# Patient Record
Sex: Female | Born: 1975 | Race: White | Hispanic: No | Marital: Single | Smoking: Never smoker
Health system: Southern US, Community
[De-identification: ages and names within clinical notes are randomized; demographics above are authoritative.]

## PROBLEM LIST (undated history)

## (undated) DIAGNOSIS — N979 Female infertility, unspecified: Secondary | ICD-10-CM

## (undated) DIAGNOSIS — Z9289 Personal history of other medical treatment: Secondary | ICD-10-CM

## (undated) DIAGNOSIS — R87610 Atypical squamous cells of undetermined significance on cytologic smear of cervix (ASC-US): Secondary | ICD-10-CM

## (undated) HISTORY — PX: WISDOM TOOTH EXTRACTION: SHX21

## (undated) HISTORY — DX: Atypical squamous cells of undetermined significance on cytologic smear of cervix (ASC-US): R87.610

## (undated) HISTORY — DX: Female infertility, unspecified: N97.9

## (undated) HISTORY — DX: Personal history of other medical treatment: Z92.89

---

## 2007-08-26 ENCOUNTER — Inpatient Hospital Stay (HOSPITAL_COMMUNITY): Admission: AD | Admit: 2007-08-26 | Discharge: 2007-08-26 | Payer: Self-pay | Admitting: *Deleted

## 2009-05-22 ENCOUNTER — Emergency Department: Payer: Self-pay | Admitting: Emergency Medicine

## 2012-11-07 ENCOUNTER — Encounter: Payer: Self-pay | Admitting: *Deleted

## 2012-11-08 ENCOUNTER — Ambulatory Visit (INDEPENDENT_AMBULATORY_CARE_PROVIDER_SITE_OTHER): Payer: BC Managed Care – PPO | Admitting: Podiatry

## 2012-11-08 DIAGNOSIS — M722 Plantar fascial fibromatosis: Secondary | ICD-10-CM

## 2012-11-08 NOTE — Patient Instructions (Signed)
WEARING INSTRUCTIONS FOR ORTHOTICS  Don't expect to be comfortable wearing your orthotic devices for the first time.  Like eyeglasses, you may be aware of them as time passes, they will not be uncomfortable and you will enjoy wearing them.  FOLLOW THESE INSTRUCTIONS EXACTLY!  1. Wear your orthotic devices for:       Not more than 1 hour the first day.       Not more than 2 hours the second day.       Not more than 3 hours the third day and so on.        Or wear them for as long as they feel comfortable.       If you experience discomfort in your feet or legs take them out.  When feet & legs feel       better, put them back in.  You do need to be consistent and wear them a [place]        everyday. 2.   If at any time the orthotic devices become acutely uncomfortable before the       time for that particular day, STOP WEARING THEM. 3.   On the next day, do not increase the wearing time. 4.   Subsequently, increase the wearing time by 15-30 minutes only if comfortable to do       so. 5.   You will be seen by your doctor about 2-4 weeks after you receive your orthotic       devices, at which time you will probably be wearing your devices comfortably        for about 8 hours or more a day. 6.   Some patients occasionally report mild aches or discomfort in other parts of the of       body such as the knees, hips or back after 3 or 4 consecutive hours of wear.  If this       is the case with you, do not extend your wearing time.  Instead, cut it back an hour or       two.  In all likelihood, these symptoms will disappear in a short period of time as your       body posture realigns itself and functions more efficiently. 7.   It is possible that your orthotic device may require some small changes or adjustment       to improve their function or make them more comfortable.   This is usually not done       before one to three months have elapsed.  These adjustments are made in        accordance  with the changed position your feet are assuming as a result of       improved biomechanical function. 8.   In women's shoes, it's not unusual for your heel to slip out of the shoe, particularly if       they are step-in-shoes.  If this is the case, try other shoes or other styles.  Try to       purchase shoes which have deeper heal seats or higher heel counters. 9.   Squeaking of orthotics devices in the shoes is due to the movement of the devices       when they are functioning normally.  To eliminate squeaking, simply dust some       baby powder into your shoes before inserting the devices.  If this does not work,          apply soap or wax to the edges of the orthotic devices or put a tissue into the shoes. 10. It is important that you follow these directions explicitly.  Failure to do so will simply       prolong the adjustment period or create problems which are easily avoided.  It makes       no difference if you are wearing your orthotic devices for only a few hours after        several months, so long as you are wearing them comfortably for those hours. 11. If you have any questions or complaints, contact our office.  We have no way of       knowing about your problems unless you tell us.  If we do not hear from you, we will       assume that you are proceeding well.  

## 2012-11-08 NOTE — Progress Notes (Signed)
Pt presents for orthotic pick up , dispensed orthotics

## 2013-08-30 ENCOUNTER — Ambulatory Visit: Payer: Self-pay | Admitting: Family Medicine

## 2014-03-05 ENCOUNTER — Ambulatory Visit: Payer: Self-pay | Admitting: Nurse Practitioner

## 2015-08-14 DIAGNOSIS — Z9289 Personal history of other medical treatment: Secondary | ICD-10-CM

## 2015-08-14 DIAGNOSIS — R87610 Atypical squamous cells of undetermined significance on cytologic smear of cervix (ASC-US): Secondary | ICD-10-CM

## 2015-08-14 HISTORY — DX: Personal history of other medical treatment: Z92.89

## 2015-08-14 HISTORY — DX: Atypical squamous cells of undetermined significance on cytologic smear of cervix (ASC-US): R87.610

## 2015-08-18 ENCOUNTER — Other Ambulatory Visit: Payer: Self-pay | Admitting: Obstetrics and Gynecology

## 2015-08-18 DIAGNOSIS — R928 Other abnormal and inconclusive findings on diagnostic imaging of breast: Secondary | ICD-10-CM

## 2015-09-03 ENCOUNTER — Ambulatory Visit
Admission: RE | Admit: 2015-09-03 | Discharge: 2015-09-03 | Disposition: A | Discharge status: Home / Self Care | Payer: BC Managed Care – PPO | Source: Ambulatory Visit | Attending: Obstetrics and Gynecology | Admitting: Obstetrics and Gynecology

## 2015-09-03 DIAGNOSIS — R921 Mammographic calcification found on diagnostic imaging of breast: Secondary | ICD-10-CM | POA: Diagnosis not present

## 2015-09-03 DIAGNOSIS — R928 Other abnormal and inconclusive findings on diagnostic imaging of breast: Secondary | ICD-10-CM

## 2016-08-02 ENCOUNTER — Telehealth: Payer: Self-pay

## 2016-08-02 NOTE — Telephone Encounter (Signed)
Pt would like an order for mammogram to be done this summer before school starts and before her annual on 8/22nd.  [phone]  ([facility]15)

## 2016-08-03 ENCOUNTER — Other Ambulatory Visit: Payer: Self-pay | Admitting: Obstetrics and Gynecology

## 2016-08-03 DIAGNOSIS — Z1239 Encounter for other screening for malignant neoplasm of breast: Secondary | ICD-10-CM

## 2016-08-03 NOTE — Telephone Encounter (Signed)
Order in. RN to notify pt to contact [facility] to sched.

## 2016-08-03 NOTE — Telephone Encounter (Signed)
Left detailed msg.

## 2016-09-08 ENCOUNTER — Other Ambulatory Visit: Payer: Self-pay | Admitting: Obstetrics and Gynecology

## 2016-09-08 ENCOUNTER — Ambulatory Visit
Admission: RE | Admit: 2016-09-08 | Discharge: 2016-09-08 | Disposition: A | Discharge status: Home / Self Care | Payer: BC Managed Care – PPO | Source: Ambulatory Visit | Attending: Obstetrics and Gynecology | Admitting: Obstetrics and Gynecology

## 2016-09-08 DIAGNOSIS — N6310 Unspecified lump in the right breast, unspecified quadrant: Secondary | ICD-10-CM

## 2016-09-08 DIAGNOSIS — R928 Other abnormal and inconclusive findings on diagnostic imaging of breast: Secondary | ICD-10-CM

## 2016-09-08 DIAGNOSIS — N6313 Unspecified lump in the right breast, lower outer quadrant: Secondary | ICD-10-CM | POA: Insufficient documentation

## 2016-09-08 DIAGNOSIS — R921 Mammographic calcification found on diagnostic imaging of breast: Secondary | ICD-10-CM | POA: Diagnosis present

## 2016-09-08 DIAGNOSIS — Z1239 Encounter for other screening for malignant neoplasm of breast: Secondary | ICD-10-CM

## 2016-09-15 ENCOUNTER — Encounter: Payer: Self-pay | Admitting: Obstetrics and Gynecology

## 2016-09-15 ENCOUNTER — Ambulatory Visit (INDEPENDENT_AMBULATORY_CARE_PROVIDER_SITE_OTHER): Payer: BC Managed Care – PPO | Admitting: Obstetrics and Gynecology

## 2016-09-15 VITALS — BP 100/60 | HR 61 | Ht 67.0 in | Wt 116.0 lb

## 2016-09-15 DIAGNOSIS — L290 Pruritus ani: Secondary | ICD-10-CM

## 2016-09-15 DIAGNOSIS — Z1151 Encounter for screening for human papillomavirus (HPV): Secondary | ICD-10-CM | POA: Diagnosis not present

## 2016-09-15 DIAGNOSIS — Z124 Encounter for screening for malignant neoplasm of cervix: Secondary | ICD-10-CM | POA: Diagnosis not present

## 2016-09-15 DIAGNOSIS — Z1239 Encounter for other screening for malignant neoplasm of breast: Secondary | ICD-10-CM

## 2016-09-15 DIAGNOSIS — Z1231 Encounter for screening mammogram for malignant neoplasm of breast: Secondary | ICD-10-CM

## 2016-09-15 DIAGNOSIS — Z01419 Encounter for gynecological examination (general) (routine) without abnormal findings: Secondary | ICD-10-CM

## 2016-09-15 NOTE — Progress Notes (Signed)
Chief Complaint  Patient presents with  . Gynecologic Exam     HPI:      [name] is a 41 y.o. No obstetric history on file. who LMP was Patient's last menstrual period was 09/15/2016., presents today for her annual examination.  Her menses are regular every 32 days, lasting 5 days.  Dysmenorrhea mild, occurring first 1-2 days of flow. She does not have intermenstrual bleeding.  Sex activity: single partner, hx of infertility.  Last Pap: August 14, 2015  Results were: ASCUS with NEGATIVE high risk HPV /neg HPV DNA  Hx of STDs: none  Last mammogram: 09/08/16  Results were: Birads 4 for RT breast mass.  There is no FH of breast cancer. There is no FH of ovarian cancer. The patient does not do self-breast exams.  Tobacco use: The patient denies current or previous tobacco use. Alcohol use: none Exercise: moderately active  She does get adequate calcium and Vitamin D in her diet.  She needs a Rx RF on analpram. 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 Analpram relieves sx and she uses infrequently.    Past Medical History:  Diagnosis Date  . Atypical squamous cell changes of undetermined significance (ASCUS) on cervical cytology with negative high risk human papilloma virus (HPV) test result 08/14/2015  . History of Papanicolaou smear of cervix 08/14/2015   ASCUS/neg  . Infertility, female     No past surgical history on file.  Family History  Problem Relation Age of Onset  . Diabetes Mother   . Hypertension Mother   . Diabetes Maternal Grandmother   . Heart disease Maternal Grandmother   . Hypertension Maternal Grandmother   . Colon cancer Paternal Grandfather   . Breast cancer Neg Hx   . Ovarian cancer Neg Hx     Social History   Social History  . Marital status: Married    Spouse name: N/A  . Number of children: N/A  . Years of education: N/A   Occupational History  . Not on file.   Social  History Main Topics  . Smoking status: Never Smoker  . Smokeless tobacco: Never Used  . Alcohol use No  . Drug use: No  . Sexual activity: Not on file   Other Topics Concern  . Not on file   Social History Narrative  . No narrative on file     Current Outpatient Prescriptions:  .  calcium acetate (PHOSLO) 667 MG capsule, Take by mouth 3 (three) times daily with meals., Disp: , Rfl:  .  vitamin B-12 (CYANOCOBALAMIN) 100 MCG tablet, Take 100 mcg by mouth daily., Disp: , Rfl:  .  hydrocortisone-pramoxine (ANALPRAM-HC) 2.5-1 % rectal cream, Place 1 application rectally 3 (three) times daily., Disp: 30 g, Rfl: 1  ROS:  Review of Systems  Constitutional: Negative for fatigue, fever and unexpected weight change.  Respiratory: Negative for cough, shortness of breath and wheezing.   Cardiovascular: Negative for chest pain, palpitations and leg swelling.  Gastrointestinal: Negative for blood in stool, constipation, diarrhea, nausea and vomiting.  Endocrine: Negative for cold intolerance, heat intolerance and polyuria.  Genitourinary: Negative for dyspareunia, dysuria, flank pain, frequency, genital sores, hematuria, menstrual problem, pelvic pain, urgency, vaginal bleeding, vaginal discharge and vaginal pain.  Musculoskeletal: Negative for back pain, joint swelling and myalgias.  Skin: Negative for rash.  Neurological: Negative for dizziness, syncope, light-headedness, numbness and headaches.  Hematological: Negative for adenopathy.  Psychiatric/Behavioral: Negative for agitation, confusion,  sleep disturbance and suicidal ideas. The patient is not nervous/anxious.      Objective: BP 100/60   Pulse 61   Ht 5\' 7"  (1.702 m)   Wt 116 lb (52.6 kg)   LMP 09/15/2016   BMI 18.17 kg/m    Physical Exam  Constitutional: She is oriented to person, place, and time. She appears well-developed and well-nourished.  Genitourinary: Vagina normal and uterus normal. There is no rash or tenderness  on the right labia. There is no rash or tenderness on the left labia. No erythema or tenderness in the vagina. No vaginal discharge found. Right adnexum does not display mass and does not display tenderness. Left adnexum does not display mass and does not display tenderness. Cervix does not exhibit motion tenderness or polyp. Uterus is not enlarged or tender.  Neck: Normal range of motion. No thyromegaly present.  Cardiovascular: Normal rate, regular rhythm and normal heart sounds.   No murmur heard. Pulmonary/Chest: Effort normal and breath sounds normal. Right breast exhibits no mass, no nipple discharge, no skin change and no tenderness. Left breast exhibits no mass, no nipple discharge, no skin change and no tenderness.  Abdominal: Soft. There is no tenderness. There is no guarding.  Musculoskeletal: Normal range of motion.  Neurological: She is alert and oriented to person, place, and time. No cranial nerve deficit.  Psychiatric: She has a normal mood and affect. Her behavior is normal.  Vitals reviewed.    Assessment/Plan: Encounter for annual routine gynecological examination  Cervical cancer screening - Plan: IGP, Aptima HPV  Screening for HPV (human papillomavirus) - Plan: IGP, Aptima HPV  Screening for breast cancer - Pt is current on mammo.  Anal itch - Rx RF analpram prn sx.  - Plan: hydrocortisone-pramoxine (ANALPRAM-HC) 2.5-1 % rectal cream             GYN counsel adequate intake of calcium and vitamin D, diet and exercise     F/U  Return in about 1 year (around 09/15/2017).  [name], PA-C 09/16/2016 10:34 AM

## 2016-09-16 ENCOUNTER — Ambulatory Visit
Admission: RE | Admit: 2016-09-16 | Discharge: 2016-09-16 | Disposition: A | Discharge status: Home / Self Care | Payer: BC Managed Care – PPO | Source: Ambulatory Visit | Attending: Obstetrics and Gynecology | Admitting: Obstetrics and Gynecology

## 2016-09-16 ENCOUNTER — Other Ambulatory Visit: Payer: Self-pay | Admitting: Obstetrics and Gynecology

## 2016-09-16 DIAGNOSIS — N6310 Unspecified lump in the right breast, unspecified quadrant: Secondary | ICD-10-CM

## 2016-09-16 DIAGNOSIS — R928 Other abnormal and inconclusive findings on diagnostic imaging of breast: Secondary | ICD-10-CM | POA: Insufficient documentation

## 2016-09-16 MED ORDER — HYDROCORTISONE ACE-PRAMOXINE 2.5-1 % RE CREA: 1.0000 "application " | TOPICAL_CREAM | Freq: Three times a day (TID) | RECTAL | 1 refills | Status: DC

## 2016-09-18 LAB — IGP, APTIMA HPV
HPV Aptima: NEGATIVE
PAP Smear Comment: 0

## 2017-07-14 ENCOUNTER — Encounter: Payer: Self-pay | Admitting: Obstetrics and Gynecology

## 2017-07-14 ENCOUNTER — Ambulatory Visit (INDEPENDENT_AMBULATORY_CARE_PROVIDER_SITE_OTHER): Payer: BC Managed Care – PPO | Admitting: Obstetrics and Gynecology

## 2017-07-14 VITALS — BP 118/68 | HR 77 | Ht 67.0 in | Wt 108.0 lb

## 2017-07-14 DIAGNOSIS — L290 Pruritus ani: Secondary | ICD-10-CM | POA: Diagnosis not present

## 2017-07-14 DIAGNOSIS — B3731 Acute candidiasis of vulva and vagina: Secondary | ICD-10-CM

## 2017-07-14 DIAGNOSIS — B373 Candidiasis of vulva and vagina: Secondary | ICD-10-CM

## 2017-07-14 LAB — POCT WET PREP WITH KOH
Clue Cells Wet Prep HPF POC: NEGATIVE
KOH Prep POC: NEGATIVE
TRICHOMONAS UA: NEGATIVE
YEAST WET PREP PER HPF POC: NEGATIVE

## 2017-07-14 MED ORDER — HYDROCORTISONE ACE-PRAMOXINE 2.5-1 % RE CREA: 1.0000 "application " | TOPICAL_CREAM | Freq: Three times a day (TID) | RECTAL | 1 refills | Status: DC

## 2017-07-14 MED ORDER — FLUCONAZOLE 150 MG PO TABS: 150.0000 mg | ORAL_TABLET | Freq: Once | ORAL | 0 refills | Status: AC

## 2017-07-14 NOTE — Patient Instructions (Signed)
I value your feedback and entrusting us with your care. If you get a [facility] patient survey, I would appreciate you taking the time to let us know about your experience today. Thank you! 

## 2017-07-14 NOTE — Progress Notes (Signed)
Dear, Julie Iba, MD (Inactive)   Chief Complaint  Patient presents with  . Vaginitis    HPI:      [name] is a 42 y.o. No obstetric history on file. who LMP was No LMP recorded., presents today for vaginal itch, no d/c, odor for about a wk. Pt took leftover, expired diflucan that didn't help. Took 2nd diflucan 2 days ago with some improvement. Hx of yeast vag in past, requiring 2 diflucan. Doesn't like creams. No recent abx use, no new soaps/detergents/shower gels. Going camping in 2 days and wants relief.  Pt also needs RF on analpram crm prn anal itch.  Has annual 9/19.   Past Medical History:  Diagnosis Date  . Atypical squamous cell changes of undetermined significance (ASCUS) on cervical cytology with negative high risk human papilloma virus (HPV) test result 08/14/2015  . History of Papanicolaou smear of cervix 08/14/2015   ASCUS/neg  . Infertility, female     History reviewed. No pertinent surgical history.  Family History  Problem Relation Age of Onset  . Diabetes Mother   . Hypertension Mother   . Diabetes Maternal Grandmother   . Heart disease Maternal Grandmother   . Hypertension Maternal Grandmother   . Colon cancer Paternal Grandfather   . Breast cancer Neg Hx   . Ovarian cancer Neg Hx     Social History   Socioeconomic History  . Marital status: Married    Spouse name: Not on file  . Number of children: Not on file  . Years of education: Not on file  . Highest education level: Not on file  Occupational History  . Not on file  Social Needs  . Financial resource strain: Not on file  . Food insecurity:    Worry: Not on file    Inability: Not on file  . Transportation needs:    Medical: Not on file    Non-medical: Not on file  Tobacco Use  . Smoking status: Never Smoker  . Smokeless tobacco: Never Used  Substance and Sexual Activity  . Alcohol use: No  . Drug use: No  . Sexual activity: Yes  Lifestyle  . Physical activity:    Days  per week: Not on file    Minutes per session: Not on file  . Stress: Not on file  Relationships  . Social connections:    Talks on phone: Not on file    Gets together: Not on file    Attends religious service: Not on file    Active member of club or organization: Not on file    Attends meetings of clubs or organizations: Not on file    Relationship status: Not on file  . Intimate partner violence:    Fear of current or ex partner: Not on file    Emotionally abused: Not on file    Physically abused: Not on file    Forced sexual activity: Not on file  Other Topics Concern  . Not on file  Social History Narrative  . Not on file    Outpatient Medications Prior to Visit  Medication Sig Dispense Refill  . calcium acetate (PHOSLO) 667 MG capsule Take by mouth 3 (three) times daily with meals.    . vitamin B-12 (CYANOCOBALAMIN) 100 MCG tablet Take 100 mcg by mouth daily.    . hydrocortisone-pramoxine (ANALPRAM-HC) 2.5-1 % rectal cream Place 1 application rectally 3 (three) times daily. 30 g 1   No facility-administered medications prior to visit.  ROS:  Review of Systems  Constitutional: Negative for fever.  Gastrointestinal: Negative for blood in stool, constipation, diarrhea, nausea and vomiting.  Genitourinary: Negative for dyspareunia, dysuria, flank pain, frequency, hematuria, urgency, vaginal bleeding, vaginal discharge and vaginal pain.  Musculoskeletal: Negative for back pain.  Skin: Negative for rash.   BREAST: No symptoms   OBJECTIVE:   Vitals:  BP 118/68   Pulse 77   Ht 5\' 7"  (1.702 m)   Wt 108 lb (49 kg)   BMI 16.92 kg/m   Physical Exam  Constitutional: She is oriented to person, place, and time. Vital signs are normal. She appears well-developed.  Pulmonary/Chest: Effort normal.  Genitourinary: Vagina normal and uterus normal. There is rash on the right labia. There is no tenderness or lesion on the right labia. There is rash on the left labia.  There is no tenderness or lesion on the left labia. Uterus is not enlarged and not tender. Cervix exhibits no motion tenderness. Right adnexum displays no mass and no tenderness. Left adnexum displays no mass and no tenderness. No erythema or tenderness in the vagina. No vaginal discharge found.  Musculoskeletal: Normal range of motion.  Neurological: She is alert and oriented to person, place, and time.  Psychiatric: She has a normal mood and affect. Her behavior is normal. Thought content normal.  Vitals reviewed.   Results: Results for orders placed or performed in visit on 07/14/17 (from the past 24 hour(s))  POCT Wet Prep with KOH     Status: Normal   Collection Time: 07/14/17 11:51 AM  Result Value Ref Range   Trichomonas, UA Negative    Clue Cells Wet Prep HPF POC neg    Epithelial Wet Prep HPF POC  Few, Moderate, Many, Too numerous to count   Yeast Wet Prep HPF POC neg    Bacteria Wet Prep HPF POC  Few   RBC Wet Prep HPF POC     WBC Wet Prep HPF POC     KOH Prep POC Negative Negative     Assessment/Plan: Candidal vaginitis - Neg wet prep/pos exam. Rx diflucan. OTC hydrocortisone crm. F/u prn.  - Plan: fluconazole (DIFLUCAN) 150 MG tablet, POCT Wet Prep with KOH  Anal itch - Rx RF analpram prn sx.  - Plan: hydrocortisone-pramoxine (ANALPRAM-HC) 2.5-1 % rectal cream    Meds ordered this encounter  Medications  . fluconazole (DIFLUCAN) 150 MG tablet    Sig: Take 1 tablet (150 mg total) by mouth once for 1 dose. Can repeat after 4 days prn    Dispense:  2 tablet    Refill:  0    Order Specific Question:   Supervising Provider    Answer:   Nadara MustardHARRIS, ROBERT P [[id number]]  . hydrocortisone-pramoxine (ANALPRAM-HC) 2.5-1 % rectal cream    Sig: Place 1 application rectally 3 (three) times daily.    Dispense:  30 g    Refill:  1    Order Specific Question:   Supervising Provider    Answer:   Nadara MustardHARRIS, ROBERT P [[id number]]      Return if symptoms worsen or fail to improve.  Alicia  B. Copland, PA-C 07/14/2017 11:51 AM

## 2017-09-28 ENCOUNTER — Ambulatory Visit: Payer: BC Managed Care – PPO | Admitting: Obstetrics and Gynecology

## 2017-11-15 IMAGING — US US BREAST*R* LIMITED INC AXILLA
1 series · 6 of 6 positions shown · non-contrast
Comparison: Previous exam(s).

CLINICAL DATA: Follow-up of probably benign left breast
calcifications

EXAM:
2D DIGITAL DIAGNOSTIC BILATERAL MAMMOGRAM WITH CAD AND ADJUNCT TOMO
ULTRASOUND RIGHT BREAST

[Series 1: us breast*right* limited inc axilla · 0.03mm/px · 6 of 6 slices shown]
[im 1/6]
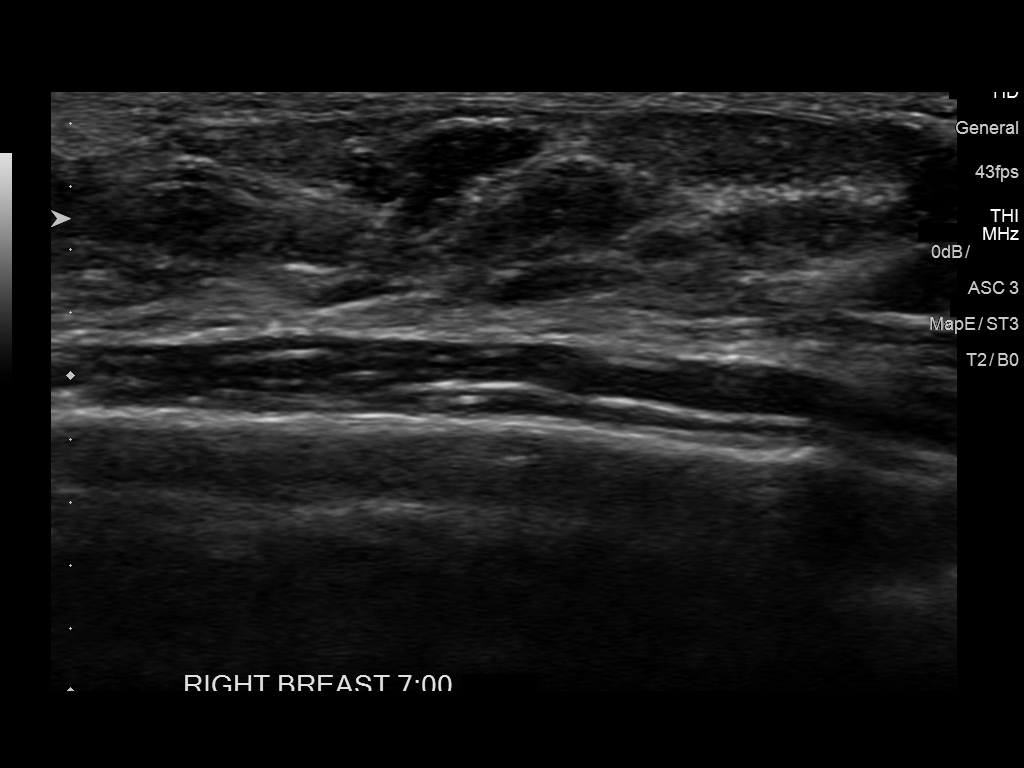
[im 2/6]
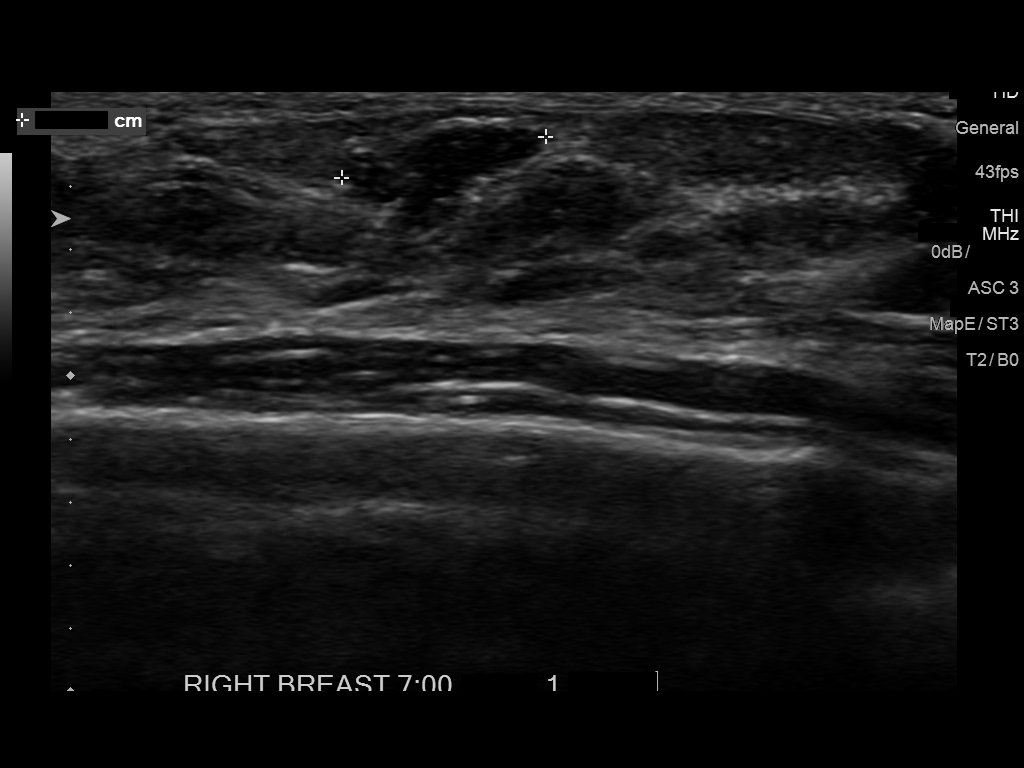
[im 3/6]
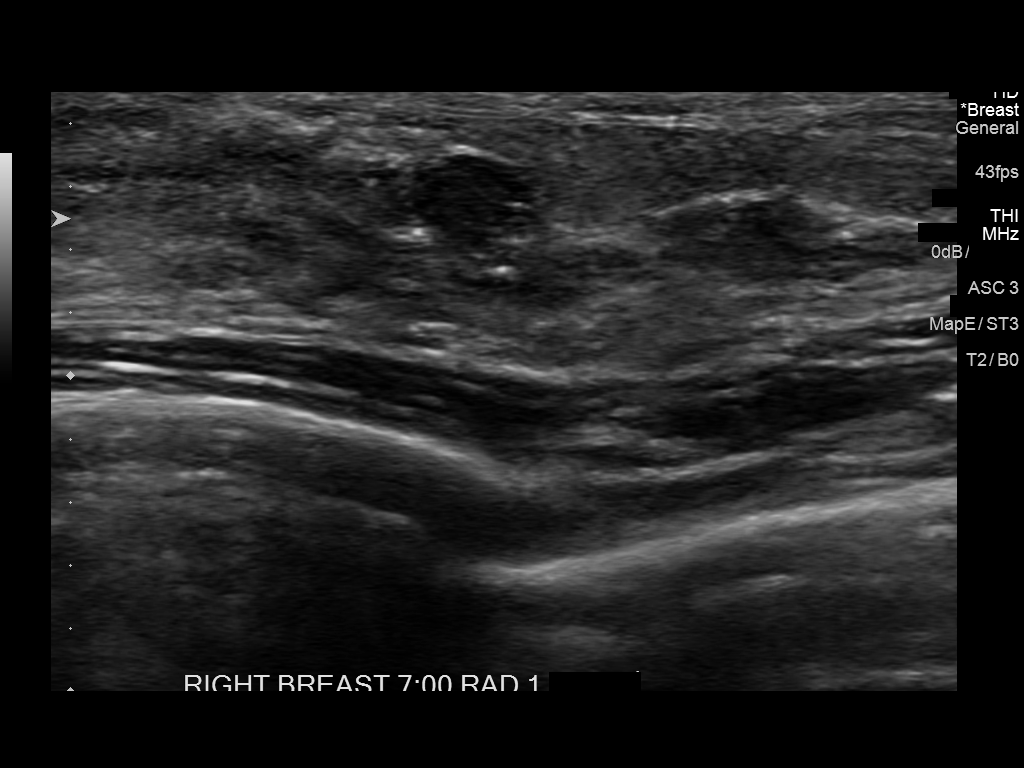
[im 4/6]
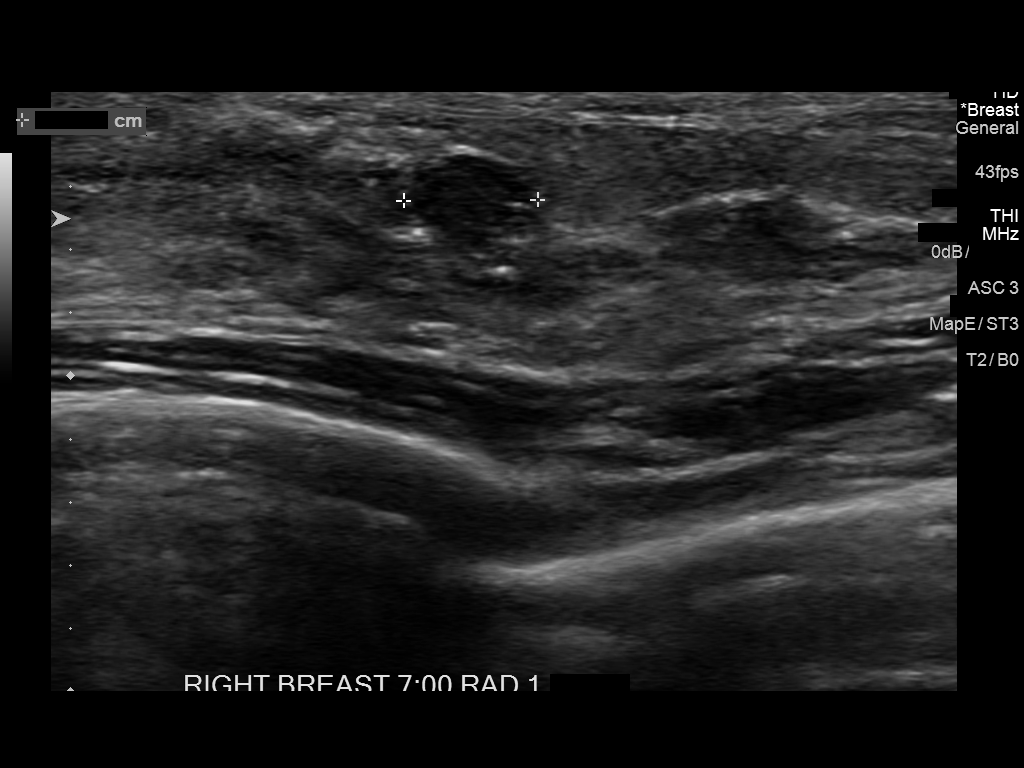
[im 5/6]
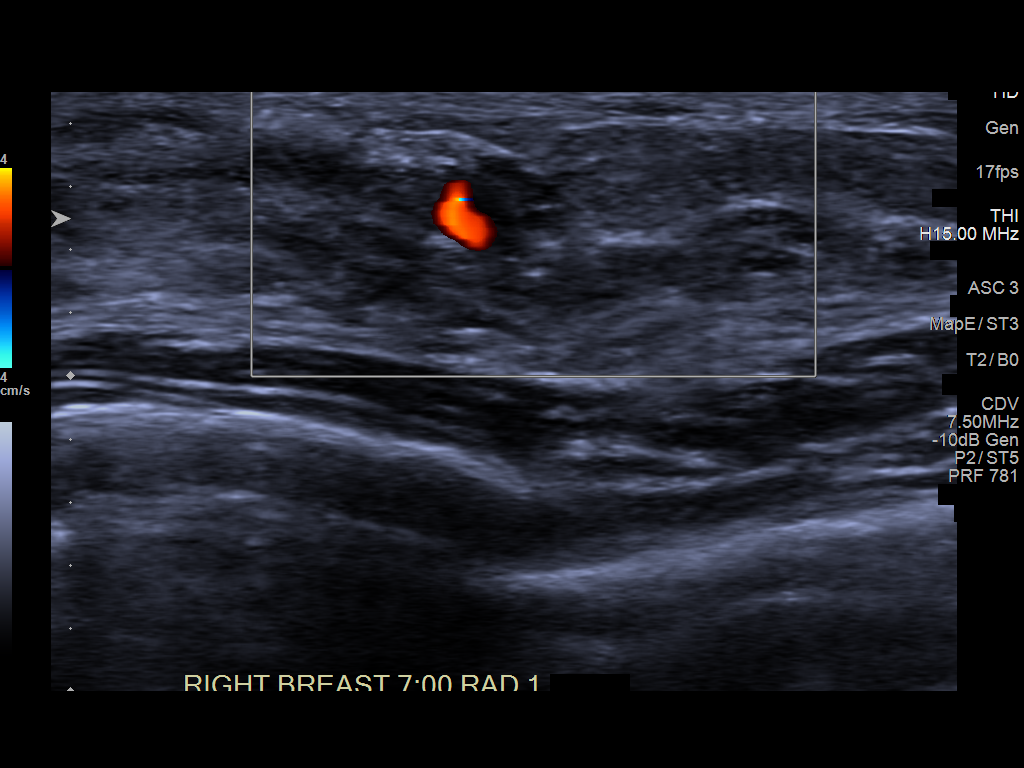
[im 6/6]
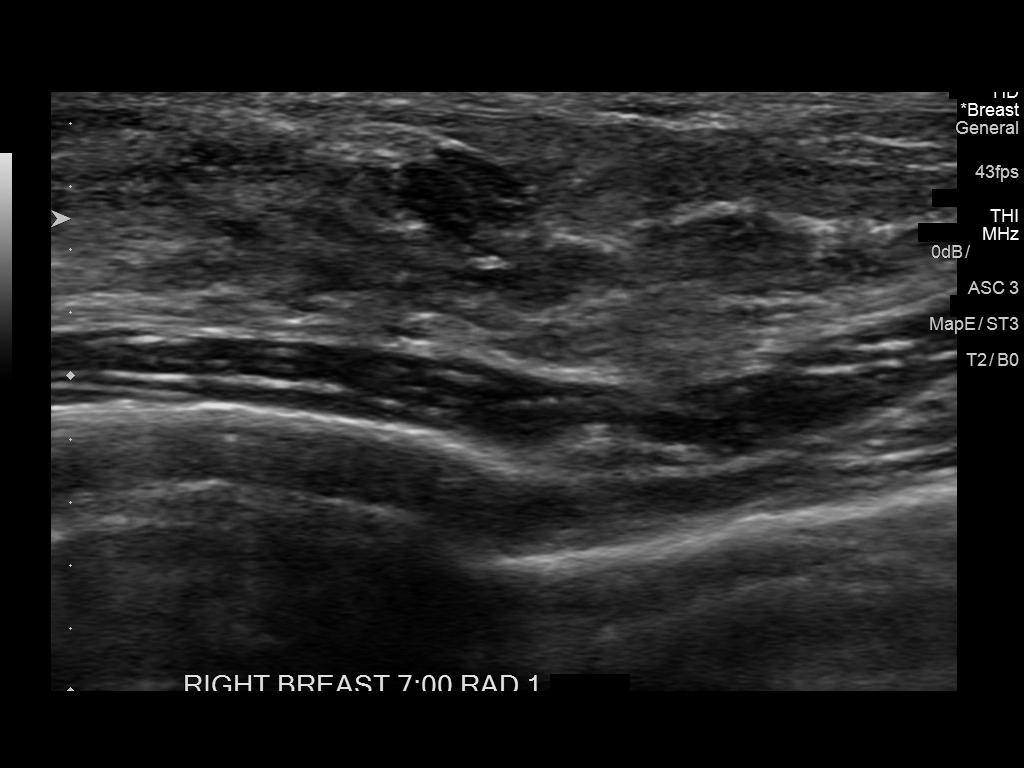

[6 of 6 positions shown; findings below may reference images not displayed]

ACR Breast Density Category d: The breast tissue is extremely dense,
which lowers the sensitivity of mammography.
FINDINGS: The previously noted calcifications in the anterior, subareolar left
breast are not significantly changed dating back to the time of
initial evaluation in July 2013, consistent with a benign etiology.
A possible asymmetry was identified within the subareolar right
breast on the CC view. This finding persisted but was less prominent
on spot compression imaging of the area.

Mammographic images were processed with CAD.

Targeted ultrasound of the inferior right breast demonstrates an
irregular, hypoechoic mass at 7 o'clock, 1 cm from the nipple
measuring approximately 7 x 3 x 4 mm. This finding may correspond to
the asymmetry seen mammographically. No additional suspicious
sonographic finding was identified in the area of concern in the
inferior right breast. Ultrasound of the right axilla demonstrates
no suspicious appearing axillary lymph nodes.
IMPRESSION: Indeterminate right breast mass at 7 o'clock, 1 cm from the nipple.

RECOMMENDATION:
Ultrasound-guided right breast biopsy.

I have discussed the findings and recommendations with the patient.
Results were also provided in writing at the conclusion of the
visit. If applicable, a reminder letter will be sent to the patient
regarding the next appointment.

BI-RADS CATEGORY  4: Suspicious.

## 2018-07-05 ENCOUNTER — Other Ambulatory Visit: Payer: Self-pay | Admitting: Obstetrics and Gynecology

## 2018-07-05 DIAGNOSIS — Z1231 Encounter for screening mammogram for malignant neoplasm of breast: Secondary | ICD-10-CM

## 2018-07-24 ENCOUNTER — Ambulatory Visit
Admission: RE | Admit: 2018-07-24 | Discharge: 2018-07-24 | Disposition: A | Discharge status: Home / Self Care | Payer: BC Managed Care – PPO | Source: Ambulatory Visit | Attending: Obstetrics and Gynecology | Admitting: Obstetrics and Gynecology

## 2018-07-24 ENCOUNTER — Other Ambulatory Visit: Payer: Self-pay

## 2018-07-24 DIAGNOSIS — Z1231 Encounter for screening mammogram for malignant neoplasm of breast: Secondary | ICD-10-CM | POA: Insufficient documentation

## 2018-07-25 ENCOUNTER — Encounter: Payer: Self-pay | Admitting: Obstetrics and Gynecology

## 2018-10-11 ENCOUNTER — Ambulatory Visit (INDEPENDENT_AMBULATORY_CARE_PROVIDER_SITE_OTHER): Payer: BC Managed Care – PPO | Admitting: Obstetrics and Gynecology

## 2018-10-11 ENCOUNTER — Encounter: Payer: Self-pay | Admitting: Obstetrics and Gynecology

## 2018-10-11 ENCOUNTER — Other Ambulatory Visit: Payer: Self-pay

## 2018-10-11 VITALS — BP 110/70 | Ht 67.0 in | Wt 111.0 lb

## 2018-10-11 DIAGNOSIS — B373 Candidiasis of vulva and vagina: Secondary | ICD-10-CM

## 2018-10-11 DIAGNOSIS — B3731 Acute candidiasis of vulva and vagina: Secondary | ICD-10-CM

## 2018-10-11 DIAGNOSIS — L290 Pruritus ani: Secondary | ICD-10-CM

## 2018-10-11 DIAGNOSIS — Z1239 Encounter for other screening for malignant neoplasm of breast: Secondary | ICD-10-CM

## 2018-10-11 DIAGNOSIS — Z01419 Encounter for gynecological examination (general) (routine) without abnormal findings: Secondary | ICD-10-CM | POA: Diagnosis not present

## 2018-10-11 MED ORDER — HYDROCORT-PRAMOXINE (PERIANAL) 2.5-1 % EX CREA: TOPICAL_CREAM | Freq: Three times a day (TID) | CUTANEOUS | 0 refills | Status: DC

## 2018-10-11 MED ORDER — FLUCONAZOLE 150 MG PO TABS: 150.0000 mg | ORAL_TABLET | Freq: Once | ORAL | 1 refills | Status: AC

## 2018-10-11 NOTE — Patient Instructions (Signed)
I value your feedback and entrusting us with your care. If you get a [facility] patient survey, I would appreciate you taking the time to let us know about your experience today. Thank you! 

## 2018-10-11 NOTE — Progress Notes (Signed)
Chief Complaint  Patient presents with  . Gynecologic Exam     HPI:      Julie Reed is a 43 y.o. No obstetric history on file. who LMP was Patient's last menstrual period was 09/29/2018 (exact date)., presents today for her annual examination.  Her menses are regular every 32 days, lasting 4 days.  Dysmenorrhea mild, occurring first 1-2 days of flow. She does not have intermenstrual bleeding.  Sex activity: single partner, hx of infertility.  Last Pap: 09/15/16 Results were: no abnormalities /neg HPV DNA  Hx of STDs: none  Last mammogram: 07/24/18 Results were: normal; repeat in 12 months. Birads 4 for RT breast mass 2018 that ended up being a cyst that was aspirated and resolved.  There is no FH of breast cancer. There is no FH of ovarian cancer. The patient does not do self-breast exams.  Tobacco use: The patient denies current or previous tobacco use. Alcohol use: none  No drug use. Exercise:  very active  She does get adequate calcium but not Vitamin D in her diet.  She needs a Rx RF on analpram. 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 Analpram relieves sx and she uses infrequently.  Would also like diflucan Rx for occas yeast vag sx. No recent sx this yr.   Declines fasting labs.   Past Medical History:  Diagnosis Date  . Atypical squamous cell changes of undetermined significance (ASCUS) on cervical cytology with negative high risk human papilloma virus (HPV) test result 08/14/2015  . History of Papanicolaou smear of cervix 08/14/2015   ASCUS/neg  . Infertility, female     History reviewed. No pertinent surgical history.  Family History  Problem Relation Age of Onset  . Diabetes Mother   . Hypertension Mother   . Diabetes Maternal Grandmother   . Heart disease Maternal Grandmother   . Hypertension Maternal Grandmother   . Colon cancer Paternal Grandfather   . Breast cancer Neg Hx   . Ovarian  cancer Neg Hx     Social History   Socioeconomic History  . Marital status: Married    Spouse name: Not on file  . Number of children: Not on file  . Years of education: Not on file  . Highest education level: Not on file  Occupational History  . Not on file  Social Needs  . Financial resource strain: Not on file  . Food insecurity    Worry: Not on file    Inability: Not on file  . Transportation needs    Medical: Not on file    Non-medical: Not on file  Tobacco Use  . Smoking status: Never Smoker  . Smokeless tobacco: Never Used  Substance and Sexual Activity  . Alcohol use: No  . Drug use: No  . Sexual activity: Yes    Birth control/protection: None  Lifestyle  . Physical activity    Days per week: Not on file    Minutes per session: Not on file  . Stress: Not on file  Relationships  . Social Musicianconnections    Talks on phone: Not on file    Gets together: Not on file    Attends religious service: Not on file    Active member of club or organization: Not on file    Attends meetings of clubs or organizations: Not on file    Relationship status: Not on file  . Intimate partner violence    Fear of current  or ex partner: Not on file    Emotionally abused: Not on file    Physically abused: Not on file    Forced sexual activity: Not on file  Other Topics Concern  . Not on file  Social History Narrative  . Not on file     Current Outpatient Medications:  .  vitamin B-12 (CYANOCOBALAMIN) 100 MCG tablet, Take 100 mcg by mouth daily., Disp: , Rfl:  .  fluconazole (DIFLUCAN) 150 MG tablet, Take 1 tablet (150 mg total) by mouth once for 1 dose., Disp: 1 tablet, Rfl: 1 .  hydrocortisone-pramoxine (ANALPRAM-HC) 2.5-1 % rectal cream, Place rectally 3 (three) times daily., Disp: 30 g, Rfl: 0  ROS:  Review of Systems  Constitutional: Negative for fatigue, fever and unexpected weight change.  Respiratory: Negative for cough, shortness of breath and wheezing.    Cardiovascular: Negative for chest pain, palpitations and leg swelling.  Gastrointestinal: Negative for blood in stool, constipation, diarrhea, nausea and vomiting.  Endocrine: Negative for cold intolerance, heat intolerance and polyuria.  Genitourinary: Negative for dyspareunia, dysuria, flank pain, frequency, genital sores, hematuria, menstrual problem, pelvic pain, urgency, vaginal bleeding, vaginal discharge and vaginal pain.  Musculoskeletal: Negative for back pain, joint swelling and myalgias.  Skin: Negative for rash.  Neurological: Negative for dizziness, syncope, light-headedness, numbness and headaches.  Hematological: Negative for adenopathy.  Psychiatric/Behavioral: Negative for agitation, confusion, sleep disturbance and suicidal ideas. The patient is not nervous/anxious.      Objective: BP 110/70   Ht 5\' 7"  (1.702 m)   Wt 111 lb (50.3 kg)   LMP 09/29/2018 (Exact Date)   BMI 17.39 kg/m    Physical Exam Constitutional:      Appearance: She is well-developed.  Genitourinary:     Vulva, vagina, uterus, right adnexa and left adnexa normal.     No vulval lesion or tenderness noted.     No vaginal discharge, erythema or tenderness.     No cervical motion tenderness or polyp.     Uterus is not enlarged or tender.     No right or left adnexal mass present.     Right adnexa not tender.     Left adnexa not tender.  Neck:     Musculoskeletal: Normal range of motion.     Thyroid: No thyromegaly.  Cardiovascular:     Rate and Rhythm: Normal rate and regular rhythm.     Heart sounds: Normal heart sounds. No murmur.  Pulmonary:     Effort: Pulmonary effort is normal.     Breath sounds: Normal breath sounds.  Chest:     Breasts:        Right: No mass, nipple discharge, skin change or tenderness.        Left: No mass, nipple discharge, skin change or tenderness.  Abdominal:     Palpations: Abdomen is soft.     Tenderness: There is no abdominal tenderness. There is no  guarding.  Musculoskeletal: Normal range of motion.  Neurological:     General: No focal deficit present.     Mental Status: She is alert and oriented to person, place, and time.     Cranial Nerves: No cranial nerve deficit.  Skin:    General: Skin is warm and dry.  Psychiatric:        Mood and Affect: Mood normal.        Behavior: Behavior normal.        Thought Content: Thought content normal.  Judgment: Judgment normal.  Vitals signs reviewed.     Assessment/Plan: Encounter for annual routine gynecological examination  Screening for breast cancer--pt current on mammo  Candidal vaginitis - Plan: fluconazole (DIFLUCAN) 150 MG tablet; Rx RF prn sx.  Anal itch - Plan: hydrocortisone-pramoxine (ANALPRAM-HC) 2.5-1 % rectal cream; Rx RF prn sx.             Meds ordered this encounter  Medications  . hydrocortisone-pramoxine (ANALPRAM-HC) 2.5-1 % rectal cream    Sig: Place rectally 3 (three) times daily.    Dispense:  30 g    Refill:  0    Order Specific Question:   Supervising Provider    Answer:   Gae Dry [id number]  . fluconazole (DIFLUCAN) 150 MG tablet    Sig: Take 1 tablet (150 mg total) by mouth once for 1 dose.    Dispense:  1 tablet    Refill:  1    Order Specific Question:   Supervising Provider    Answer:   Gae Dry [[id number]]    GYN counsel adequate intake of calcium and vitamin D, diet and exercise     F/U  Return in about 1 year (around 10/11/2019).   B. , PA-C 10/11/2018 4:19 PM

## 2018-10-24 ENCOUNTER — Telehealth: Payer: Self-pay

## 2018-10-24 NOTE — Telephone Encounter (Signed)
Pt calling; MyChart not working for her; [facility] was supposed to call in rx for her - hydrocortisone-pramoxine and it was not.  [phone] Left detailed msg that rx was called in 9/16, pharm recv'd it, [facility] [address].  Number given for MyChart help.  Asked pt to call re medication.

## 2018-10-24 NOTE — Telephone Encounter (Signed)
Pt calling; [facility] has rx; pt will call number for help c MyChart.  Pt appreciative of msg.

## 2019-01-10 ENCOUNTER — Ambulatory Visit (INDEPENDENT_AMBULATORY_CARE_PROVIDER_SITE_OTHER): Payer: BC Managed Care – PPO | Admitting: Orthotics

## 2019-01-10 ENCOUNTER — Other Ambulatory Visit: Payer: Self-pay

## 2019-01-10 DIAGNOSIS — M6788 Other specified disorders of synovium and tendon, other site: Secondary | ICD-10-CM

## 2019-01-10 DIAGNOSIS — M722 Plantar fascial fibromatosis: Secondary | ICD-10-CM

## 2019-01-10 NOTE — Progress Notes (Signed)

## 2019-02-07 ENCOUNTER — Telehealth: Payer: Self-pay | Admitting: Podiatry

## 2019-02-07 NOTE — Telephone Encounter (Signed)
Pt left message yesterday asking status of orthotics.  I returned call and left message they are in [place] office but will be in  office tomorrow and ok to pick up.Marland Kitchen

## 2019-03-24 ENCOUNTER — Ambulatory Visit: Payer: BC Managed Care – PPO | Attending: Internal Medicine

## 2019-03-24 DIAGNOSIS — Z23 Encounter for immunization: Secondary | ICD-10-CM | POA: Insufficient documentation

## 2019-03-24 NOTE — Progress Notes (Signed)
   Covid-19 Vaccination Clinic  Name:  Julie Reed    MRN: [id number] DOB: 1976-01-24  03/24/2019  Julie Reed was observed post Covid-19 immunization for 15 minutes without incidence. She was provided with Vaccine Information Sheet and instruction to access the V-Safe system.   Julie Reed was instructed to call 911 with any severe reactions post vaccine: Marland Kitchen Difficulty breathing  . Swelling of your face and throat  . A fast heartbeat  . A bad rash all over your body  . Dizziness and weakness    Immunizations Administered    Name Date Dose VIS Date Route   Moderna COVID-19 Vaccine 03/24/2019  4:07 PM 0.5 mL 12/26/2018 Intramuscular   Manufacturer: Moderna   Lot: [id number]   NDC: 44695-072-25

## 2019-04-21 ENCOUNTER — Ambulatory Visit: Payer: BC Managed Care – PPO | Attending: Internal Medicine

## 2019-04-21 DIAGNOSIS — Z23 Encounter for immunization: Secondary | ICD-10-CM

## 2019-04-21 NOTE — Progress Notes (Signed)
   Covid-19 Vaccination Clinic  Name:  Julie Reed    MRN: [id number] DOB: [birth year]  04/21/2019  [name] was observed post Covid-19 immunization for 15 minutes without incident. She was provided with Vaccine Information Sheet and instruction to access the V-Safe system.   [name] was instructed to call 911 with any severe reactions post vaccine: Marland Kitchen Difficulty breathing  . Swelling of face and throat  . A fast heartbeat  . A bad rash all over body  . Dizziness and weakness   Immunizations Administered    Name Date Dose VIS Date Route   Moderna COVID-19 Vaccine 04/21/2019  1:13 PM 0.5 mL 12/26/2018 Intramuscular   Manufacturer: Gala Murdoch   Lot: [id number]   NDC: 72536-644-03

## 2019-06-29 ENCOUNTER — Other Ambulatory Visit: Payer: Self-pay | Admitting: Obstetrics and Gynecology

## 2019-06-29 DIAGNOSIS — Z1231 Encounter for screening mammogram for malignant neoplasm of breast: Secondary | ICD-10-CM

## 2019-07-26 ENCOUNTER — Ambulatory Visit
Admission: RE | Admit: 2019-07-26 | Discharge: 2019-07-26 | Disposition: A | Discharge status: Home / Self Care | Payer: BC Managed Care – PPO | Source: Ambulatory Visit | Attending: Obstetrics and Gynecology | Admitting: Obstetrics and Gynecology

## 2019-07-26 DIAGNOSIS — Z1231 Encounter for screening mammogram for malignant neoplasm of breast: Secondary | ICD-10-CM | POA: Insufficient documentation

## 2019-07-28 ENCOUNTER — Encounter: Payer: Self-pay | Admitting: Obstetrics and Gynecology

## 2019-08-27 ENCOUNTER — Telehealth: Payer: Self-pay

## 2019-08-27 NOTE — Telephone Encounter (Signed)
Spoke w/patient to get more details of symptoms. Patient reports first symptom of frequency/urgency about a week ago. That improved, but has come back and now she's having some itching (she's had yeast infections before), but this doesn't feel like yeast (more like UTI). Apt schedule for nurse visit tomorrow for Urine Dip as Provider schedules are full. Will consult w/[facility] on UA results.

## 2019-08-27 NOTE — Telephone Encounter (Signed)
Please advise if patient can come in for nurse visit

## 2019-08-27 NOTE — Telephone Encounter (Signed)
[facility] patient thinks she has a UTI & would like to make an apt ASAP. BT#[phone]

## 2019-08-28 ENCOUNTER — Other Ambulatory Visit: Payer: Self-pay

## 2019-08-28 ENCOUNTER — Ambulatory Visit (INDEPENDENT_AMBULATORY_CARE_PROVIDER_SITE_OTHER): Payer: BC Managed Care – PPO

## 2019-08-28 DIAGNOSIS — R3915 Urgency of urination: Secondary | ICD-10-CM | POA: Diagnosis not present

## 2019-08-28 DIAGNOSIS — R350 Frequency of micturition: Secondary | ICD-10-CM | POA: Diagnosis not present

## 2019-08-28 LAB — POCT URINALYSIS DIPSTICK
Appearance: NORMAL
Bilirubin, UA: NEGATIVE
Glucose, UA: NEGATIVE
Ketones, UA: NEGATIVE
Leukocytes, UA: NEGATIVE
Nitrite, UA: NEGATIVE
Odor: NORMAL
Protein, UA: NEGATIVE
Spec Grav, UA: 1.01 (ref 1.010–1.025)
Urobilinogen, UA: 0.2 E.U./dL
pH, UA: 6 (ref 5.0–8.0)

## 2019-08-28 NOTE — Progress Notes (Signed)
Patient present today with complaints of urinary urgency and frequency x 1 week. U/A performed (Negative) results recorded. Discussed with Althea Grimmer, PAC. Urine Culture sent. Patient aware no treatment at this time, will wait for culture results.

## 2019-08-30 ENCOUNTER — Telehealth: Payer: Self-pay | Admitting: Obstetrics and Gynecology

## 2019-08-30 LAB — URINE CULTURE

## 2019-08-30 NOTE — Progress Notes (Signed)
Pls notify pt that C&S neg for UTI. Needs to decrease caffeine intake (if applicable) and increase water. F/u prn.

## 2019-08-30 NOTE — Telephone Encounter (Signed)
Pt aware of lab results 

## 2019-08-30 NOTE — Telephone Encounter (Signed)
Patient is returning missed call. Please advise 

## 2019-08-30 NOTE — Progress Notes (Signed)
Pt aware.

## 2019-08-30 NOTE — Progress Notes (Signed)
Called pt, no answer, LVMTRC. 

## 2020-07-01 ENCOUNTER — Other Ambulatory Visit: Payer: Self-pay | Admitting: Obstetrics and Gynecology

## 2020-07-01 DIAGNOSIS — Z1231 Encounter for screening mammogram for malignant neoplasm of breast: Secondary | ICD-10-CM

## 2020-07-14 NOTE — Progress Notes (Signed)
Chief Complaint  Patient presents with   Gynecologic Exam    No concerns     HPI:      [name] is a 45 y.o. No obstetric history on file. who LMP was Patient's last menstrual period was 07/15/2020 (exact date)., presents today for her annual examination.  Her menses are regular every 32 days, lasting 3 days.  Dysmenorrhea mild, occurring first 1-2 days of flow. She does not have intermenstrual bleeding.  Sex activity: single partner, hx of infertility. No pain/bleeding. Last Pap: 09/15/16 Results were: no abnormalities /neg HPV DNA  Hx of STDs: none  Last mammogram: 07/26/19 Results were: normal; repeat in 12 months. Has appt 7/22. Hx of Birads 4 for RT breast mass 2018 that ended up being a cyst that was aspirated and resolved.  There is no FH of breast cancer. There is no FH of ovarian cancer. The patient does not do self-breast exams.  Tobacco use: The patient denies current or previous tobacco use. Alcohol use: none  No drug use. Exercise:  very active  Colonoscopy: never, declines colonoscopy/cologuard. FH colon cancer in her PGF  She does get adequate calcium and Vitamin D in her diet.  She needs a Rx RF on analpram. 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 Analpram relieves sx and she uses infrequently.  Would also like diflucan Rx for occas yeast vag sx. No recent sx this yr.   Declines fasting labs.   Past Medical History:  Diagnosis Date   Atypical squamous cell changes of undetermined significance (ASCUS) on cervical cytology with negative high risk human papilloma virus (HPV) test result 08/14/2015   History of Papanicolaou smear of cervix 08/14/2015   ASCUS/neg   Infertility, female     History reviewed. No pertinent surgical history.  Family History  Problem Relation Age of Onset   Diabetes Mother    Hypertension Mother    Diabetes Maternal Grandmother    Heart disease Maternal  Grandmother    Hypertension Maternal Grandmother    Colon cancer Paternal Grandfather    Breast cancer Neg Hx    Ovarian cancer Neg Hx     Social History   Socioeconomic History   Marital status: Married    Spouse name: Not on file   Number of children: Not on file   Years of education: Not on file   Highest education level: Not on file  Occupational History   Not on file  Tobacco Use   Smoking status: Never   Smokeless tobacco: Never  Vaping Use   Vaping Use: Never used  Substance and Sexual Activity   Alcohol use: No   Drug use: No   Sexual activity: Yes    Birth control/protection: None  Other Topics Concern   Not on file  Social History Narrative   Not on file   Social Determinants of Health   Financial Resource Strain: Not on file  Food Insecurity: Not on file  Transportation Needs: Not on file  Physical Activity: Not on file  Stress: Not on file  Social Connections: Not on file  Intimate Partner Violence: Not on file     Current Outpatient Medications:    fluconazole (DIFLUCAN) 150 MG tablet, Take 1 tablet (150 mg total) by mouth once for 1 dose., Disp: 1 tablet, Rfl: 1   vitamin B-12 (CYANOCOBALAMIN) 100 MCG tablet, Take 100 mcg by mouth daily., Disp: , Rfl:    hydrocortisone-pramoxine (ANALPRAM-HC) 2.5-1 %  rectal cream, Place rectally 3 (three) times daily., Disp: 30 g, Rfl: 0  ROS:  Review of Systems  Constitutional:  Negative for fatigue, fever and unexpected weight change.  Respiratory:  Negative for cough, shortness of breath and wheezing.   Cardiovascular:  Negative for chest pain, palpitations and leg swelling.  Gastrointestinal:  Negative for blood in stool, constipation, diarrhea, nausea and vomiting.  Endocrine: Negative for cold intolerance, heat intolerance and polyuria.  Genitourinary:  Negative for dyspareunia, dysuria, flank pain, frequency, genital sores, hematuria, menstrual problem, pelvic pain, urgency, vaginal bleeding, vaginal  discharge and vaginal pain.  Musculoskeletal:  Negative for back pain, joint swelling and myalgias.  Skin:  Negative for rash.  Neurological:  Negative for dizziness, syncope, light-headedness, numbness and headaches.  Hematological:  Negative for adenopathy.  Psychiatric/Behavioral:  Negative for agitation, confusion, sleep disturbance and suicidal ideas. The patient is not nervous/anxious.     Objective: BP 110/70   Ht 5\' 7"  (1.702 m)   Wt 115 lb (52.2 kg)   LMP 07/15/2020 (Exact Date)   BMI 18.01 kg/m    Physical Exam Constitutional:      Appearance: She is well-developed.  Genitourinary:     Vulva normal.     Right Labia: No rash, tenderness or lesions.    Left Labia: No tenderness, lesions or rash.    No vaginal discharge, erythema or tenderness.      Right Adnexa: not tender and no mass present.    Left Adnexa: not tender and no mass present.    No cervical motion tenderness, friability or polyp.     Uterus is not enlarged or tender.  Breasts:    Right: No mass, nipple discharge, skin change or tenderness.     Left: No mass, nipple discharge, skin change or tenderness.  Neck:     Thyroid: No thyromegaly.  Cardiovascular:     Rate and Rhythm: Normal rate and regular rhythm.     Heart sounds: Normal heart sounds. No murmur heard. Pulmonary:     Effort: Pulmonary effort is normal.     Breath sounds: Normal breath sounds.  Abdominal:     Palpations: Abdomen is soft.     Tenderness: There is no abdominal tenderness. There is no guarding or rebound.  Musculoskeletal:        General: Normal range of motion.     Cervical back: Normal range of motion.  Lymphadenopathy:     Cervical: No cervical adenopathy.  Neurological:     General: No focal deficit present.     Mental Status: She is alert and oriented to person, place, and time.     Cranial Nerves: No cranial nerve deficit.  Skin:    General: Skin is warm and dry.  Psychiatric:        Mood and Affect: Mood  normal.        Behavior: Behavior normal.        Thought Content: Thought content normal.        Judgment: Judgment normal.  Vitals reviewed.    Assessment/Plan: Encounter for annual routine gynecological examination  Encounter for screening mammogram for malignant neoplasm of breast--pt has mammo appt  Screening for colon cancer--colonoscopy/cologuard discussed. Pt declines colon cancer screening at this time.   Blood tests for routine general physical examination--pt declines labs.   Anal itch - Plan: hydrocortisone-pramoxine (ANALPRAM-HC) 2.5-1 % rectal cream; Rx RF. F/u prn.   Candidal vaginitis - Plan: fluconazole (DIFLUCAN) 150 MG tablet; Rx RF. F/u prn.  Meds ordered this encounter  Medications   hydrocortisone-pramoxine (ANALPRAM-HC) 2.5-1 % rectal cream    Sig: Place rectally 3 (three) times daily.    Dispense:  30 g    Refill:  0    Order Specific Question:   Supervising Provider    Answer:   Nadara Mustard [[id number]]   fluconazole (DIFLUCAN) 150 MG tablet    Sig: Take 1 tablet (150 mg total) by mouth once for 1 dose.    Dispense:  1 tablet    Refill:  1    Order Specific Question:   Supervising Provider    Answer:   Nadara Mustard [[id number]]     GYN counsel adequate intake of calcium and vitamin D, diet and exercise     F/U  Return in about 1 year (around 07/15/2021).  Harshita Bernales B. Sandeep Radell, PA-C 07/15/2020 10:18 AM

## 2020-07-15 ENCOUNTER — Encounter: Payer: Self-pay | Admitting: Obstetrics and Gynecology

## 2020-07-15 ENCOUNTER — Other Ambulatory Visit: Payer: Self-pay

## 2020-07-15 ENCOUNTER — Ambulatory Visit (INDEPENDENT_AMBULATORY_CARE_PROVIDER_SITE_OTHER): Payer: BC Managed Care – PPO | Admitting: Obstetrics and Gynecology

## 2020-07-15 VITALS — BP 110/70 | Ht 67.0 in | Wt 115.0 lb

## 2020-07-15 DIAGNOSIS — L290 Pruritus ani: Secondary | ICD-10-CM | POA: Insufficient documentation

## 2020-07-15 DIAGNOSIS — B3731 Acute candidiasis of vulva and vagina: Secondary | ICD-10-CM | POA: Insufficient documentation

## 2020-07-15 DIAGNOSIS — Z1211 Encounter for screening for malignant neoplasm of colon: Secondary | ICD-10-CM

## 2020-07-15 DIAGNOSIS — Z0000 Encounter for general adult medical examination without abnormal findings: Secondary | ICD-10-CM | POA: Diagnosis not present

## 2020-07-15 DIAGNOSIS — Z01419 Encounter for gynecological examination (general) (routine) without abnormal findings: Secondary | ICD-10-CM

## 2020-07-15 DIAGNOSIS — Z1231 Encounter for screening mammogram for malignant neoplasm of breast: Secondary | ICD-10-CM

## 2020-07-15 DIAGNOSIS — B373 Candidiasis of vulva and vagina: Secondary | ICD-10-CM

## 2020-07-15 MED ORDER — FLUCONAZOLE 150 MG PO TABS: 150.0000 mg | ORAL_TABLET | Freq: Once | ORAL | 1 refills | Status: DC

## 2020-07-15 MED ORDER — HYDROCORT-PRAMOXINE (PERIANAL) 2.5-1 % EX CREA: TOPICAL_CREAM | Freq: Three times a day (TID) | CUTANEOUS | 0 refills | Status: DC

## 2020-07-15 MED ORDER — HYDROCORTISONE (PERIANAL) 2.5 % EX CREA: 1.0000 "application " | TOPICAL_CREAM | Freq: Two times a day (BID) | CUTANEOUS | 0 refills | Status: DC

## 2020-07-15 NOTE — Patient Instructions (Signed)
I value your feedback and you entrusting us with your care. If you get a [facility] patient survey, I would appreciate you taking the time to let us know about your experience today. Thank you! ? ? ?

## 2020-07-15 NOTE — Addendum Note (Signed)
Addended by: Althea Grimmer B on: 07/15/2020 11:48 AM   Modules accepted: Orders

## 2020-08-05 ENCOUNTER — Other Ambulatory Visit: Payer: Self-pay

## 2020-08-05 ENCOUNTER — Ambulatory Visit
Admission: RE | Admit: 2020-08-05 | Discharge: 2020-08-05 | Disposition: A | Discharge status: Home / Self Care | Payer: BC Managed Care – PPO | Source: Ambulatory Visit | Attending: Obstetrics and Gynecology | Admitting: Obstetrics and Gynecology

## 2020-08-05 DIAGNOSIS — Z1231 Encounter for screening mammogram for malignant neoplasm of breast: Secondary | ICD-10-CM

## 2020-08-08 ENCOUNTER — Encounter: Payer: Self-pay | Admitting: Obstetrics and Gynecology

## 2020-11-28 ENCOUNTER — Telehealth: Payer: Self-pay

## 2020-11-28 NOTE — Telephone Encounter (Signed)
Pt needs a different  cream, she has used rectal pram in the past and it worked great. Please advise if you can prescribe this.

## 2020-11-30 NOTE — Telephone Encounter (Signed)
What Rx did she get? I sent in the analpram (rectal pram is prob the generic) in 6/22.

## 2020-12-01 ENCOUNTER — Other Ambulatory Visit: Payer: Self-pay | Admitting: Obstetrics and Gynecology

## 2020-12-01 DIAGNOSIS — L290 Pruritus ani: Secondary | ICD-10-CM

## 2020-12-01 MED ORDER — HYDROCORT-PRAMOXINE (PERIANAL) 2.5-1 % EX CREA: TOPICAL_CREAM | Freq: Three times a day (TID) | CUTANEOUS | 0 refills | Status: DC

## 2020-12-01 NOTE — Telephone Encounter (Signed)
Pt says the problem is not the cream its the applicator (she did not mention that in previous msg). She needs the applicator to be long like the previous creams you have prescribed. She mentioned she doesn't recall you ever sending the analpram. You have always sent something different but they have all had the long applicator. I asked if she has asked the pharmacist about it and she said no. Did say if she has to pay out of pocket for the analpram she is willing to do so.

## 2020-12-01 NOTE — Telephone Encounter (Signed)
Called pt, no answer, LVMTRC. 

## 2020-12-01 NOTE — Progress Notes (Unsigned)
Rx RF analpram to [facility] and pt will use GoodRx coupon.

## 2020-12-01 NOTE — Telephone Encounter (Signed)
I see now where the analpram isn't covered by her insurance so we had to change it to the one she has now. The analpram contains the hydrocortisone (which is what is in this one) and that is what will treat her sx. I would try it first. If no relief, then she will have to pay for analpram out of pocket (or see if insurance formulary changes 1/23).

## 2020-12-01 NOTE — Telephone Encounter (Signed)
She said the one you called in said hydrocortisone 2%ish (she didn't have it on hand at the moment). The applicator is also different/small.

## 2020-12-01 NOTE — Telephone Encounter (Signed)
Spoke with pharmacist. Applicator is different. Discussed with pt. Rx RF analpram eRxd and pt to use GoodRx coupon since still not on formulary.

## 2020-12-01 NOTE — Telephone Encounter (Signed)
Patient is returning call. Please contact after 2:30 pm.

## 2021-01-13 ENCOUNTER — Telehealth: Payer: Self-pay

## 2021-01-13 ENCOUNTER — Other Ambulatory Visit: Payer: Self-pay | Admitting: Obstetrics and Gynecology

## 2021-01-13 DIAGNOSIS — L290 Pruritus ani: Secondary | ICD-10-CM

## 2021-01-13 MED ORDER — HYDROCORT-PRAMOXINE (PERIANAL) 2.5-1 % EX CREA: TOPICAL_CREAM | Freq: Three times a day (TID) | CUTANEOUS | 0 refills | Status: DC

## 2021-01-13 NOTE — Telephone Encounter (Signed)
Pt calling for refill of primoxing 2.5% cream; [facility] [address].  [phone]

## 2021-01-13 NOTE — Progress Notes (Signed)
Rx RF analpram for itching

## 2021-01-13 NOTE — Telephone Encounter (Signed)
How frequently is pt using crm? Rx sent 11/22 and should be used sparingly due to steroid cream.

## 2021-01-13 NOTE — Telephone Encounter (Signed)
Rx RF eRxd.  

## 2021-02-19 ENCOUNTER — Ambulatory Visit: Payer: BC Managed Care – PPO | Admitting: Obstetrics and Gynecology

## 2021-02-19 ENCOUNTER — Other Ambulatory Visit: Payer: Self-pay

## 2021-02-19 ENCOUNTER — Encounter: Payer: Self-pay | Admitting: Obstetrics and Gynecology

## 2021-02-19 VITALS — BP 100/70 | Ht 67.0 in | Wt 115.0 lb

## 2021-02-19 DIAGNOSIS — K625 Hemorrhage of anus and rectum: Secondary | ICD-10-CM | POA: Diagnosis not present

## 2021-02-19 DIAGNOSIS — K644 Residual hemorrhoidal skin tags: Secondary | ICD-10-CM | POA: Diagnosis not present

## 2021-02-19 MED ORDER — HYDROCORT-PRAMOXINE (PERIANAL) 2.5-1 % EX CREA: TOPICAL_CREAM | Freq: Three times a day (TID) | CUTANEOUS | 0 refills | Status: DC

## 2021-02-19 NOTE — Progress Notes (Signed)
Julie Reed, Julie Evener, PA-C   Chief Complaint  Patient presents with   Follow-up    Rectal pain, has to take stool softner    HPI:      [name] is a 46 y.o. G0P0000 whose LMP was Patient's last menstrual period was 02/15/2021 (exact date)., presents today for issues with external hemorrhoid. Usually very regular but has rectal pain after hard BM/constipation, improved with analpram HC crm. Hemorrhoid flares used be a few times yearly, now more frequent past few months. Is vegan and eats very healthy, drinks [place] (probiotics), eats prunes nightly. Tried stool softener with laxative and didn't like how she felt. Hasn't done fiber supp or colace recently. Has rare rectal bleeding with sx flare, declined colonoscopy in past.  FH colon cancer in [facility]. Annual due 6/23   Patient Active Problem List   Diagnosis Date Noted   External hemorrhoid 02/19/2021   Anal itch 07/15/2020   Candidal vaginitis 07/15/2020    History reviewed. No pertinent surgical history.  Family History  Problem Relation Age of Onset   Diabetes Mother    Hypertension Mother    Diabetes Maternal Grandmother    Heart disease Maternal Grandmother    Hypertension Maternal Grandmother    Colon cancer Paternal Grandfather    Breast cancer Neg Hx    Ovarian cancer Neg Hx     Social History   Socioeconomic History   Marital status: Married    Spouse name: Not on file   Number of children: Not on file   Years of education: Not on file   Highest education level: Not on file  Occupational History   Not on file  Tobacco Use   Smoking status: Never   Smokeless tobacco: Never  Vaping Use   Vaping Use: Never used  Substance and Sexual Activity   Alcohol use: No   Drug use: No   Sexual activity: Yes    Birth control/protection: None  Other Topics Concern   Not on file  Social History Narrative   Not on file   Social Determinants of Health   Financial Resource Strain: Not on file  Food  Insecurity: Not on file  Transportation Needs: Not on file  Physical Activity: Not on file  Stress: Not on file  Social Connections: Not on file  Intimate Partner Violence: Not on file    Outpatient Medications Prior to Visit  Medication Sig Dispense Refill   vitamin B-12 (CYANOCOBALAMIN) 100 MCG tablet Take 100 mcg by mouth daily.     hydrocortisone-pramoxine (ANALPRAM-HC) 2.5-1 % rectal cream Place rectally 3 (three) times daily. 30 g 0   hydrocortisone (PROCTOSOL HC) 2.5 % rectal cream Place 1 application rectally 2 (two) times daily. (Patient not taking: Reported on 02/19/2021) 30 g 0   No facility-administered medications prior to visit.      ROS:  Review of Systems  Constitutional:  Negative for fever.  Gastrointestinal:  Positive for anal bleeding, constipation and rectal pain. Negative for blood in stool, diarrhea, nausea and vomiting.  Genitourinary:  Negative for dyspareunia, dysuria, flank pain, frequency, hematuria, urgency, vaginal bleeding, vaginal discharge and vaginal pain.  Musculoskeletal:  Negative for back pain.  Skin:  Negative for rash.  BREAST: No symptoms   OBJECTIVE:   Vitals:  BP 100/70    Ht 5\' 7"  (1.702 m)    Wt 115 lb (52.2 kg)    LMP 02/15/2021 (Exact Date)    BMI 18.01 kg/m   Physical Exam  Constitutional:      Appearance: Normal appearance.  Pulmonary:     Effort: Pulmonary effort is normal.  Genitourinary:    General: Normal vulva.     Labia:        Right: No rash or tenderness.        Left: No rash or tenderness.      Rectum: Guaiac result negative. External hemorrhoid present. No anal fissure or internal hemorrhoid.  Musculoskeletal:        General: Normal range of motion.  Neurological:     Mental Status: She is alert and oriented to person, place, and time.  Psychiatric:        Judgment: Judgment normal.    Results: NEG FOBT  Assessment/Plan: External hemorrhoid - Plan: hydrocortisone-pramoxine (ANALPRAM-HC) 2.5-1 % rectal  cream; small ext hemorrhoid 7:00 position; no internal hemorrhoids. Neg FOBT. Discussed prevention of flare with benefiber/fiber supp with lots of water, colace prn. Also recommended colonoscopy for screening due to age/FH, pt declines. Rx RF analpram prn/sitz baths.   Rectal bleeding--neg FOBT today, declines colonoscopy   Meds ordered this encounter  Medications   hydrocortisone-pramoxine (ANALPRAM-HC) 2.5-1 % rectal cream    Sig: Place rectally 3 (three) times daily.    Dispense:  30 g    Refill:  0    Pt to [email or web address] coupon    Order Specific Question:   Supervising Provider    Answer:   Julie Reed [id number]      Return if symptoms worsen or fail to improve.  Julie Reed B. Herby Amick, PA-C 02/19/2021 3:39 PM

## 2021-04-01 ENCOUNTER — Other Ambulatory Visit: Payer: Self-pay | Admitting: Obstetrics and Gynecology

## 2021-04-01 DIAGNOSIS — K644 Residual hemorrhoidal skin tags: Secondary | ICD-10-CM

## 2021-05-13 ENCOUNTER — Other Ambulatory Visit: Payer: Self-pay | Admitting: Obstetrics and Gynecology

## 2021-05-13 DIAGNOSIS — K644 Residual hemorrhoidal skin tags: Secondary | ICD-10-CM

## 2021-05-13 MED ORDER — HYDROCORT-PRAMOXINE (PERIANAL) 2.5-1 % EX CREA: TOPICAL_CREAM | Freq: Three times a day (TID) | CUTANEOUS | 0 refills | Status: DC

## 2021-06-29 ENCOUNTER — Other Ambulatory Visit: Payer: Self-pay | Admitting: Obstetrics and Gynecology

## 2021-06-29 DIAGNOSIS — K644 Residual hemorrhoidal skin tags: Secondary | ICD-10-CM

## 2021-06-29 MED ORDER — HYDROCORT-PRAMOXINE (PERIANAL) 2.5-1 % EX CREA: TOPICAL_CREAM | Freq: Three times a day (TID) | CUTANEOUS | 0 refills | Status: DC

## 2021-07-14 ENCOUNTER — Other Ambulatory Visit: Payer: Self-pay | Admitting: Obstetrics and Gynecology

## 2021-07-14 DIAGNOSIS — Z1231 Encounter for screening mammogram for malignant neoplasm of breast: Secondary | ICD-10-CM

## 2021-08-16 NOTE — Progress Notes (Unsigned)
No chief complaint on file.    HPI:      [name] is a 46 y.o. No obstetric history on file. who LMP was No LMP recorded., presents today for her annual examination.  Her menses are regular every 32 days, lasting 3 days.  Dysmenorrhea mild, occurring first 1-2 days of flow. She does not have intermenstrual bleeding.  Sex activity: single partner, hx of infertility. No pain/bleeding. Last Pap: 09/15/16 Results were: no abnormalities /neg HPV DNA  Hx of STDs: none  Last mammogram: 08/17/21*** Results were: normal; repeat in 12 months.  Hx of Birads 4 for RT breast mass 2018 that ended up being a cyst that was aspirated and resolved.  There is no FH of breast cancer. There is no FH of ovarian cancer. The patient does not do self-breast exams.  Tobacco use: The patient denies current or previous tobacco use. Alcohol use: none  No drug use. Exercise:  very active  Colonoscopy: never, declines colonoscopy/cologuard. FH colon cancer in her [facility] Hx of external hemorrhoids, occas rectal bleedig; doing anusol????  She does get adequate calcium and Vitamin D in her diet.  She needs a Rx RF on analpram. 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 Analpram relieves sx and she uses infrequently.  Would also like diflucan Rx for occas yeast vag sx. No recent sx this yr.   Declines fasting labs.   Past Medical History:  Diagnosis Date   Atypical squamous cell changes of undetermined significance (ASCUS) on cervical cytology with negative high risk human papilloma virus (HPV) test result 08/14/2015   History of Papanicolaou smear of cervix 08/14/2015   ASCUS/neg   Infertility, female     No past surgical history on file.  Family History  Problem Relation Age of Onset   Diabetes Mother    Hypertension Mother    Diabetes Maternal Grandmother    Heart disease Maternal Grandmother    Hypertension Maternal Grandmother     Colon cancer Paternal Grandfather    Breast cancer Neg Hx    Ovarian cancer Neg Hx     Social History   Socioeconomic History   Marital status: Single    Spouse name: Not on file   Number of children: Not on file   Years of education: Not on file   Highest education level: Not on file  Occupational History   Not on file  Tobacco Use   Smoking status: Never   Smokeless tobacco: Never  Vaping Use   Vaping Use: Never used  Substance and Sexual Activity   Alcohol use: No   Drug use: No   Sexual activity: Yes    Birth control/protection: None  Other Topics Concern   Not on file  Social History Narrative   Not on file   Social Determinants of Health   Financial Resource Strain: Not on file  Food Insecurity: Not on file  Transportation Needs: Not on file  Physical Activity: Not on file  Stress: Not on file  Social Connections: Not on file  Intimate Partner Violence: Not on file     Current Outpatient Medications:    hydrocortisone-pramoxine (ANALPRAM-HC) 2.5-1 % rectal cream, Place rectally 3 (three) times daily., Disp: 30 g, Rfl: 0   vitamin B-12 (CYANOCOBALAMIN) 100 MCG tablet, Take 100 mcg by mouth daily., Disp: , Rfl:   ROS:  Review of Systems  Constitutional:  Negative for fatigue, fever and unexpected weight change.  Respiratory:  Negative for cough, shortness of breath and wheezing.   Cardiovascular:  Negative for chest pain, palpitations and leg swelling.  Gastrointestinal:  Negative for blood in stool, constipation, diarrhea, nausea and vomiting.  Endocrine: Negative for cold intolerance, heat intolerance and polyuria.  Genitourinary:  Negative for dyspareunia, dysuria, flank pain, frequency, genital sores, hematuria, menstrual problem, pelvic pain, urgency, vaginal bleeding, vaginal discharge and vaginal pain.  Musculoskeletal:  Negative for back pain, joint swelling and myalgias.  Skin:  Negative for rash.  Neurological:  Negative for dizziness, syncope,  light-headedness, numbness and headaches.  Hematological:  Negative for adenopathy.  Psychiatric/Behavioral:  Negative for agitation, confusion, sleep disturbance and suicidal ideas. The patient is not nervous/anxious.      Objective: There were no vitals taken for this visit.   Physical Exam Constitutional:      Appearance: She is well-developed.  Genitourinary:     Vulva normal.     Right Labia: No rash, tenderness or lesions.    Left Labia: No tenderness, lesions or rash.    No vaginal discharge, erythema or tenderness.      Right Adnexa: not tender and no mass present.    Left Adnexa: not tender and no mass present.    No cervical motion tenderness, friability or polyp.     Uterus is not enlarged or tender.  Breasts:    Right: No mass, nipple discharge, skin change or tenderness.     Left: No mass, nipple discharge, skin change or tenderness.  Neck:     Thyroid: No thyromegaly.  Cardiovascular:     Rate and Rhythm: Normal rate and regular rhythm.     Heart sounds: Normal heart sounds. No murmur heard. Pulmonary:     Effort: Pulmonary effort is normal.     Breath sounds: Normal breath sounds.  Abdominal:     Palpations: Abdomen is soft.     Tenderness: There is no abdominal tenderness. There is no guarding or rebound.  Musculoskeletal:        General: Normal range of motion.     Cervical back: Normal range of motion.  Lymphadenopathy:     Cervical: No cervical adenopathy.  Neurological:     General: No focal deficit present.     Mental Status: She is alert and oriented to person, place, and time.     Cranial Nerves: No cranial nerve deficit.  Skin:    General: Skin is warm and dry.  Psychiatric:        Mood and Affect: Mood normal.        Behavior: Behavior normal.        Thought Content: Thought content normal.        Judgment: Judgment normal.  Vitals reviewed.     Assessment/Plan: Encounter for annual routine gynecological examination  Encounter for  screening mammogram for malignant neoplasm of breast--pt has mammo appt  Screening for colon cancer--colonoscopy/cologuard discussed. Pt declines colon cancer screening at this time.   Blood tests for routine general physical examination--pt declines labs.   Anal itch - Plan: hydrocortisone-pramoxine (ANALPRAM-HC) 2.5-1 % rectal cream; Rx RF. F/u prn.   Candidal vaginitis - Plan: fluconazole (DIFLUCAN) 150 MG tablet; Rx RF. F/u prn.   No orders of the defined types were placed in this encounter.    GYN counsel adequate intake of calcium and vitamin D, diet and exercise     F/U  No follow-ups on file.  Keymon Mcelroy B. Emalina Dubreuil, PA-C 08/16/2021 9:53 PM

## 2021-08-17 ENCOUNTER — Ambulatory Visit
Admission: RE | Admit: 2021-08-17 | Discharge: 2021-08-17 | Disposition: A | Discharge status: Home / Self Care | Payer: BC Managed Care – PPO | Source: Ambulatory Visit | Attending: Obstetrics and Gynecology | Admitting: Obstetrics and Gynecology

## 2021-08-17 DIAGNOSIS — Z1231 Encounter for screening mammogram for malignant neoplasm of breast: Secondary | ICD-10-CM | POA: Diagnosis present

## 2021-08-20 ENCOUNTER — Other Ambulatory Visit (HOSPITAL_COMMUNITY)
Admission: RE | Admit: 2021-08-20 | Discharge: 2021-08-20 | Disposition: A | Discharge status: Home / Self Care | Payer: BC Managed Care – PPO | Source: Ambulatory Visit | Attending: Obstetrics and Gynecology | Admitting: Obstetrics and Gynecology

## 2021-08-20 ENCOUNTER — Ambulatory Visit (INDEPENDENT_AMBULATORY_CARE_PROVIDER_SITE_OTHER): Payer: BC Managed Care – PPO | Admitting: Obstetrics and Gynecology

## 2021-08-20 ENCOUNTER — Encounter: Payer: Self-pay | Admitting: Obstetrics and Gynecology

## 2021-08-20 VITALS — BP 100/60 | Ht 67.0 in | Wt 114.0 lb

## 2021-08-20 DIAGNOSIS — Z01419 Encounter for gynecological examination (general) (routine) without abnormal findings: Secondary | ICD-10-CM | POA: Diagnosis not present

## 2021-08-20 DIAGNOSIS — Z1151 Encounter for screening for human papillomavirus (HPV): Secondary | ICD-10-CM

## 2021-08-20 DIAGNOSIS — Z1231 Encounter for screening mammogram for malignant neoplasm of breast: Secondary | ICD-10-CM | POA: Diagnosis not present

## 2021-08-20 DIAGNOSIS — K644 Residual hemorrhoidal skin tags: Secondary | ICD-10-CM

## 2021-08-20 DIAGNOSIS — Z124 Encounter for screening for malignant neoplasm of cervix: Secondary | ICD-10-CM | POA: Insufficient documentation

## 2021-08-20 DIAGNOSIS — Z1211 Encounter for screening for malignant neoplasm of colon: Secondary | ICD-10-CM

## 2021-08-20 DIAGNOSIS — Z0000 Encounter for general adult medical examination without abnormal findings: Secondary | ICD-10-CM

## 2021-08-20 MED ORDER — HYDROCORT-PRAMOXINE (PERIANAL) 2.5-1 % EX CREA: TOPICAL_CREAM | Freq: Three times a day (TID) | CUTANEOUS | 2 refills | Status: DC

## 2021-08-20 NOTE — Patient Instructions (Signed)
I value your feedback and you entrusting us with your care. If you get a [name] patient survey, I would appreciate you taking the time to let us know about your experience today. Thank you! ? ? ?

## 2021-08-24 LAB — CYTOLOGY - PAP
Comment: NEGATIVE
High risk HPV: POSITIVE — AB

## 2021-08-28 ENCOUNTER — Telehealth: Payer: Self-pay

## 2021-08-28 NOTE — Telephone Encounter (Signed)
Pt calling; had a missed call from [place]; is in and out of cell range over the next day or so; okay to leave msg and she'll try to get back with [place] next week.  [phone]

## 2021-08-31 NOTE — Telephone Encounter (Signed)
Patient is calling to follow up on message left. Patient is aware of [facility] only in the office on Tuesday and Thursday's.

## 2021-09-01 ENCOUNTER — Telehealth: Payer: Self-pay | Admitting: Obstetrics and Gynecology

## 2021-09-01 NOTE — Telephone Encounter (Signed)
Done

## 2021-09-01 NOTE — Telephone Encounter (Signed)
Contacted pt to schedule a colpo per [facility].  Left message for pt to call back to schedule.

## 2021-09-03 ENCOUNTER — Ambulatory Visit (INDEPENDENT_AMBULATORY_CARE_PROVIDER_SITE_OTHER): Payer: BC Managed Care – PPO | Admitting: Podiatry

## 2021-09-03 DIAGNOSIS — Q6671 Congenital pes cavus, right foot: Secondary | ICD-10-CM | POA: Diagnosis not present

## 2021-09-03 DIAGNOSIS — Q667 Congenital pes cavus, unspecified foot: Secondary | ICD-10-CM

## 2021-09-03 DIAGNOSIS — Q6672 Congenital pes cavus, left foot: Secondary | ICD-10-CM

## 2021-09-03 NOTE — Progress Notes (Signed)
  Subjective:  Patient ID: Julie Reed, female    DOB: [birth year],  MRN: [id number]  Chief Complaint  Patient presents with   Foot Orthotics    46 y.o. female presents with the above complaint.  Patient presents with complaint of bilateral high arch foot.  Patient states that there is no pain however her orthotics are starting wear off.  She would like to obtain another 1.  She has not seen anyone else prior to seeing me.  She denies any other acute complaints.  Pain scale 0 out of 10.  She has been ambulating with her orthotics and seems to give her support and no pain.   Review of Systems: Negative except as noted in the HPI. Denies N/V/F/Ch.  Past Medical History:  Diagnosis Date   Atypical squamous cell changes of undetermined significance (ASCUS) on cervical cytology with negative high risk human papilloma virus (HPV) test result 08/14/2015   History of Papanicolaou smear of cervix 08/14/2015   ASCUS/neg   Infertility, female     Current Outpatient Medications:    hydrocortisone-pramoxine (ANALPRAM-HC) 2.5-1 % rectal cream, Place rectally 3 (three) times daily. For 1-2 wks prn sx, Disp: 30 g, Rfl: 2   Magnesium Glycinate 100 MG CAPS, , Disp: , Rfl:    vitamin B-12 (CYANOCOBALAMIN) 100 MCG tablet, Take 100 mcg by mouth daily., Disp: , Rfl:   Social History   Tobacco Use  Smoking Status Never  Smokeless Tobacco Never    Allergies  Allergen Reactions   Sulfa Antibiotics Other (See Comments)   Objective:  There were no vitals filed for this visit. There is no height or weight on file to calculate BMI. Constitutional Well developed. Well nourished.  Vascular Dorsalis pedis pulses palpable bilaterally. Posterior tibial pulses palpable bilaterally. Capillary refill normal to all digits.  No cyanosis or clubbing noted. Pedal hair growth normal.  Neurologic Normal speech. Oriented to person, place, and time. Epicritic sensation to light touch grossly present bilaterally.   Dermatologic Nails well groomed and normal in appearance. No open wounds. No skin lesions.  Orthopedic: Gait examination shows pes cavus foot structure with high arch foot structure semireducible.  No hyperkeratotic lesions noted.  Tight plantar fascia noted   Radiographs: None Assessment:   1. Pes cavus    Plan:  Patient was evaluated and treated and all questions answered.  Pes cavus cavus -I explained to patient the etiology of pes cavus and relationship with arch pain and various treatment options were discussed.  Given patient foot structure in the setting of arch pain I believe patient will benefit from custom-made orthotics to help control the hindfoot motion support the arch of the foot and take the stress away from plantar fascial.  Patient agrees with the plan like to proceed with orthotics -Patient was casted for orthotics  -  No follow-ups on file.

## 2021-09-03 NOTE — Telephone Encounter (Signed)
Pt is scheduled with Dr. Marice Potter on 8/29.

## 2021-09-17 ENCOUNTER — Telehealth: Payer: Self-pay | Admitting: Podiatry

## 2021-09-17 NOTE — Telephone Encounter (Signed)
Lvm for patient to call office to sched appointment for OPU  

## 2021-09-22 ENCOUNTER — Ambulatory Visit (INDEPENDENT_AMBULATORY_CARE_PROVIDER_SITE_OTHER): Payer: BC Managed Care – PPO | Admitting: Obstetrics & Gynecology

## 2021-09-22 ENCOUNTER — Telehealth: Payer: Self-pay | Admitting: Podiatry

## 2021-09-22 ENCOUNTER — Encounter: Payer: Self-pay | Admitting: Obstetrics & Gynecology

## 2021-09-22 ENCOUNTER — Other Ambulatory Visit (HOSPITAL_COMMUNITY)
Admission: RE | Admit: 2021-09-22 | Discharge: 2021-09-22 | Disposition: A | Discharge status: Home / Self Care | Payer: BC Managed Care – PPO | Source: Ambulatory Visit | Attending: Obstetrics & Gynecology | Admitting: Obstetrics & Gynecology

## 2021-09-22 VITALS — BP 120/80 | Ht 67.0 in | Wt 115.0 lb

## 2021-09-22 DIAGNOSIS — R87612 Low grade squamous intraepithelial lesion on cytologic smear of cervix (LGSIL): Secondary | ICD-10-CM | POA: Insufficient documentation

## 2021-09-22 NOTE — Telephone Encounter (Signed)
Pt scheduled to see Dr Allena Katz for a quick visit 9.5.2023 to pick up orthotics

## 2021-09-22 NOTE — Telephone Encounter (Signed)
Reed called asking about picking up her orthotics. 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  Per Christan ok to put on doctors schedule at 400pm for this Reed only.Marland Kitchen

## 2021-09-22 NOTE — Progress Notes (Signed)
   Subjective:    Patient ID: Salome Arnt, female    DOB: 16-Aug-1975, 46 y.o.   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 She thinks that she might have had an abnormal pap in the very distant past but she denies any treatment to her cervix.  She is monogamous.   Review of Systems She has fertility issues, doesn't use contaception.     Objective:   Physical Exam Well nourished, well hydrated White female, no apparent distress She is ambulating and conversing normally. UPT negative, consent signed, time out done Cervix prepped with acetic acid. Transformation zone seen in its entirety after dilating her stenotic cervix. Colpo adequate. Her cervix appears completely normal ECC obtained. She tolerated the procedure well.      Assessment & Plan:   LGSIL on pap, normal colpo I have reassured her that if the ECC is negative/benign, all she needs is a pap next year. If it has abnormal cells, then I would rec a LEEP versus CKC. I will let her know via Mychart.

## 2021-09-24 LAB — SURGICAL PATHOLOGY

## 2021-09-25 ENCOUNTER — Encounter: Payer: Self-pay | Admitting: Obstetrics & Gynecology

## 2021-09-29 ENCOUNTER — Ambulatory Visit (INDEPENDENT_AMBULATORY_CARE_PROVIDER_SITE_OTHER): Payer: BC Managed Care – PPO | Admitting: Podiatry

## 2021-09-29 ENCOUNTER — Encounter: Payer: Self-pay | Admitting: Obstetrics & Gynecology

## 2021-09-29 DIAGNOSIS — Q667 Congenital pes cavus, unspecified foot: Secondary | ICD-10-CM

## 2021-10-06 NOTE — Progress Notes (Signed)
Orthotics were picked up and is functioning well.  Fitting well in the shoes I discussed the break-in period as well.

## 2021-11-10 ENCOUNTER — Ambulatory Visit: Payer: BC Managed Care – PPO | Admitting: Podiatry

## 2021-11-10 ENCOUNTER — Encounter: Payer: Self-pay | Admitting: Podiatry

## 2021-11-10 DIAGNOSIS — M7752 Other enthesopathy of left foot: Secondary | ICD-10-CM | POA: Diagnosis not present

## 2021-11-13 ENCOUNTER — Encounter: Payer: Self-pay | Admitting: Podiatry

## 2021-11-13 NOTE — Progress Notes (Signed)
  Subjective:  Patient ID: Julie Reed, female    DOB: [birth year],  MRN: [id number]  Chief Complaint  Patient presents with   Foot Pain    Pt stated that she still has some discomfort     46 y.o. female presents with the above complaint.  Patient presents with left first MTP capsulitis hurts with ambulation worse with pressure.  Patient states that there is still some discomfort.  The orthotics are doing okay.  They are fitting well.  She wanted to get it evaluated she would like to know if she can do a steroid injection.   Review of Systems: Negative except as noted in the HPI. Denies N/V/F/Ch.  Past Medical History:  Diagnosis Date   Atypical squamous cell changes of undetermined significance (ASCUS) on cervical cytology with negative high risk human papilloma virus (HPV) test result 08/14/2015   History of Papanicolaou smear of cervix 08/14/2015   ASCUS/neg   Infertility, female     Current Outpatient Medications:    amoxicillin (AMOXIL) 875 MG tablet, SMARTSIG:1 Tablet(s) By Mouth Every 12 Hours, Disp: , Rfl:    hydrocortisone-pramoxine (ANALPRAM-HC) 2.5-1 % rectal cream, Place rectally 3 (three) times daily. For 1-2 wks prn sx, Disp: 30 g, Rfl: 2   Magnesium Glycinate 100 MG CAPS, , Disp: , Rfl:    vitamin B-12 (CYANOCOBALAMIN) 100 MCG tablet, Take 100 mcg by mouth daily., Disp: , Rfl:   Social History   Tobacco Use  Smoking Status Never  Smokeless Tobacco Never    Allergies  Allergen Reactions   Sulfa Antibiotics Other (See Comments)   Objective:  There were no vitals filed for this visit. There is no height or weight on file to calculate BMI. Constitutional Well developed. Well nourished.  Vascular Dorsalis pedis pulses palpable bilaterally. Posterior tibial pulses palpable bilaterally. Capillary refill normal to all digits.  No cyanosis or clubbing noted. Pedal hair growth normal.  Neurologic Normal speech. Oriented to person, place, and time. Epicritic  sensation to light touch grossly present bilaterally.  Dermatologic Nails well groomed and normal in appearance. No open wounds. No skin lesions.  Orthopedic: Pain on palpation left first metatarsophalangeal joint mild pain with range of motion.  No deep intra-articular pain noted no arthritis noted.   Radiographs: None Assessment:   1. Capsulitis of metatarsophalangeal (MTP) joint of left foot    Plan:  Patient was evaluated and treated and all questions answered.  Left first MTP capsulitis -All questions and concerns were discussed with the patient in extensive detail -I explained to the patient that given the amount of pain that she is having she will benefit from a steroid injection to help decreasing inflammatory component associate with pain.  Patient agrees with plan like to proceed with steroid injection -A steroid injection was performed at left first MTP using 1% plain Lidocaine and 10 mg of Kenalog. This was well tolerated. -Orthotics are functioning well   No follow-ups on file.

## 2021-12-15 ENCOUNTER — Telehealth: Payer: Self-pay

## 2021-12-15 ENCOUNTER — Other Ambulatory Visit: Payer: Self-pay | Admitting: Obstetrics and Gynecology

## 2021-12-15 DIAGNOSIS — K644 Residual hemorrhoidal skin tags: Secondary | ICD-10-CM

## 2021-12-15 MED ORDER — HYDROCORT-PRAMOXINE (PERIANAL) 2.5-1 % EX CREA: TOPICAL_CREAM | Freq: Three times a day (TID) | CUTANEOUS | 2 refills | Status: DC

## 2021-12-15 NOTE — Progress Notes (Signed)
Rx RF analpram prn hemorrhoid flare

## 2021-12-15 NOTE — Telephone Encounter (Signed)
Pt calling for refill of her analpram; is unable to request it thru mychart; needs refill; hydrocortisone pramoxine 2.5%  [phone]  [facility] - was sent in 08/20/21 c 2 refills.

## 2021-12-15 NOTE — Telephone Encounter (Signed)
Rx RF eRxd.  

## 2021-12-16 NOTE — Telephone Encounter (Signed)
Called pt and left detailed msg Rx RF sent to pharmacy.

## 2022-03-24 ENCOUNTER — Telehealth: Payer: Self-pay

## 2022-03-24 NOTE — Telephone Encounter (Signed)
Pt calling; has yeast inf and wants fluconazole called in; also needs refill of hydrocortisone-pramoxine 2.5-1% - wants it ordered now b/c it usually take a long time to get it in and doesn't want to be completely out of it.  [phone]

## 2022-03-25 ENCOUNTER — Other Ambulatory Visit: Payer: Self-pay | Admitting: Obstetrics and Gynecology

## 2022-03-25 DIAGNOSIS — K644 Residual hemorrhoidal skin tags: Secondary | ICD-10-CM

## 2022-03-25 DIAGNOSIS — B3731 Acute candidiasis of vulva and vagina: Secondary | ICD-10-CM

## 2022-03-25 MED ORDER — HYDROCORT-PRAMOXINE (PERIANAL) 2.5-1 % EX CREA: TOPICAL_CREAM | Freq: Three times a day (TID) | CUTANEOUS | 2 refills | Status: DC

## 2022-03-25 MED ORDER — FLUCONAZOLE 150 MG PO TABS: 150.0000 mg | ORAL_TABLET | Freq: Once | ORAL | 0 refills | Status: AC

## 2022-03-25 NOTE — Telephone Encounter (Signed)
Rx Rfs eRxd.

## 2022-03-25 NOTE — Progress Notes (Signed)
Rx RF analpram prn sx. Rx RF diflucan prn yeast vag

## 2022-03-25 NOTE — Telephone Encounter (Signed)
Left detailed msg that [facility] has sent in her refills.

## 2022-06-23 ENCOUNTER — Telehealth: Payer: Self-pay | Admitting: Obstetrics and Gynecology

## 2022-06-23 ENCOUNTER — Other Ambulatory Visit: Payer: Self-pay | Admitting: Obstetrics and Gynecology

## 2022-06-23 DIAGNOSIS — Z13220 Encounter for screening for lipoid disorders: Secondary | ICD-10-CM

## 2022-06-23 DIAGNOSIS — Z0000 Encounter for general adult medical examination without abnormal findings: Secondary | ICD-10-CM

## 2022-06-23 NOTE — Progress Notes (Signed)
Lab orders before annual.  

## 2022-06-23 NOTE — Telephone Encounter (Signed)
Patient is aware and scheduled

## 2022-06-23 NOTE — Telephone Encounter (Signed)
Lab orders placed, pt needs fasting lab appt. Thx.

## 2022-06-23 NOTE — Telephone Encounter (Signed)
Patient called this morning, to schedule Annual with Alicia Copland.  She stated that she would also be needing labs.  Wanted to know it you would drop them so that she can do them prior to her annual visit.  Please Advise and I will contact patient back

## 2022-08-20 ENCOUNTER — Other Ambulatory Visit: Payer: BC Managed Care – PPO

## 2022-08-22 NOTE — Progress Notes (Unsigned)
No chief complaint on file.    HPI:      [name] is a 47 y.o. No obstetric history on file. who LMP was No LMP recorded., presents today for her annual examination.  Her menses are regular every 28 days, lasting 3 days.  Dysmenorrhea mild, occurring first 1-2 days of flow. She does not have intermenstrual bleeding.  Sex activity: single partner, hx of infertility. No pain/bleeding. Last Pap: 08/20/21 Results were: LGSIL/POS HPV DNA; 09/22/21 with neg colpo bx; repeat pap due in 1 yr Hx of STDs: none  Last mammogram: 08/17/21 Results were: normal; repeat in 12 months.  Hx of Birads 4 for RT breast mass 2018 that ended up being a cyst that was aspirated and resolved.  There is no FH of breast cancer. There is no FH of ovarian cancer. The patient does not do self-breast exams.  Tobacco use: The patient denies current or previous tobacco use. Alcohol use: none  No drug use. Exercise:  very active  Colonoscopy: never, declines colonoscopy/cologuard. FH colon cancer in her PGF Hx of external hemorrhoids, rare rectal bleedig; doing anusol with sx relief; has to use for about a wk. She needs a Rx RF on analpram. She has flares with occas constipation and feels it's related to a food allergy. She is vegan for the most part without sx but unsure what is triggering flares. Doesn't do sitz baths, takes colace prn.   She does get adequate calcium but not Vitamin D in her diet.  Declines fasting labs.   Past Medical History:  Diagnosis Date   Atypical squamous cell changes of undetermined significance (ASCUS) on cervical cytology with negative high risk human papilloma virus (HPV) test result 08/14/2015   History of Papanicolaou smear of cervix 08/14/2015   ASCUS/neg   Infertility, female     Past Surgical History:  Procedure Laterality Date   WISDOM TOOTH EXTRACTION      Family History  Problem Relation Age of Onset   Diabetes Mother    Hypertension Mother    Diabetes Maternal  Grandmother    Heart disease Maternal Grandmother    Hypertension Maternal Grandmother    Colon cancer Paternal Grandfather    Breast cancer Neg Hx    Ovarian cancer Neg Hx     Social History   Socioeconomic History   Marital status: Single    Spouse name: Not on file   Number of children: Not on file   Years of education: Not on file   Highest education level: Not on file  Occupational History   Not on file  Tobacco Use   Smoking status: Never   Smokeless tobacco: Never  Vaping Use   Vaping status: Never Used  Substance and Sexual Activity   Alcohol use: No   Drug use: No   Sexual activity: Yes    Birth control/protection: None  Other Topics Concern   Not on file  Social History Narrative   Not on file   Social Determinants of Health   Financial Resource Strain: Not on file  Food Insecurity: Not on file  Transportation Needs: Not on file  Physical Activity: Not on file  Stress: Not on file  Social Connections: Not on file  Intimate Partner Violence: Not on file     Current Outpatient Medications:    amoxicillin (AMOXIL) 875 MG tablet, SMARTSIG:1 Tablet(s) By Mouth Every 12 Hours, Disp: , Rfl:    hydrocortisone-pramoxine (ANALPRAM-HC) 2.5-1 % rectal cream, Place rectally 3 (  three) times daily. For 1-2 wks prn sx, Disp: 30 g, Rfl: 2   Magnesium Glycinate 100 MG CAPS, , Disp: , Rfl:    vitamin B-12 (CYANOCOBALAMIN) 100 MCG tablet, Take 100 mcg by mouth daily., Disp: , Rfl:   ROS:  Review of Systems  Constitutional:  Negative for fatigue, fever and unexpected weight change.  Respiratory:  Negative for cough, shortness of breath and wheezing.   Cardiovascular:  Negative for chest pain, palpitations and leg swelling.  Gastrointestinal:  Negative for blood in stool, constipation, diarrhea, nausea and vomiting.  Endocrine: Negative for cold intolerance, heat intolerance and polyuria.  Genitourinary:  Negative for dyspareunia, dysuria, flank pain, frequency, genital  sores, hematuria, menstrual problem, pelvic pain, urgency, vaginal bleeding, vaginal discharge and vaginal pain.  Musculoskeletal:  Negative for back pain, joint swelling and myalgias.  Skin:  Negative for rash.  Neurological:  Negative for dizziness, syncope, light-headedness, numbness and headaches.  Hematological:  Negative for adenopathy.  Psychiatric/Behavioral:  Negative for agitation, confusion, sleep disturbance and suicidal ideas. The patient is not nervous/anxious.      Objective: There were no vitals taken for this visit.   Physical Exam Constitutional:      Appearance: She is well-developed.  Genitourinary:     Vulva normal.     Right Labia: No rash, tenderness or lesions.    Left Labia: No tenderness, lesions or rash.    No vaginal discharge, erythema or tenderness.      Right Adnexa: not tender and no mass present.    Left Adnexa: not tender and no mass present.    No cervical motion tenderness, friability or polyp.     Uterus is not enlarged or tender.  Breasts:    Right: No mass, nipple discharge, skin change or tenderness.     Left: No mass, nipple discharge, skin change or tenderness.  Neck:     Thyroid: No thyromegaly.  Cardiovascular:     Rate and Rhythm: Normal rate and regular rhythm.     Heart sounds: Normal heart sounds. No murmur heard. Pulmonary:     Effort: Pulmonary effort is normal.     Breath sounds: Normal breath sounds.  Abdominal:     Palpations: Abdomen is soft.     Tenderness: There is no abdominal tenderness. There is no guarding or rebound.  Musculoskeletal:        General: Normal range of motion.     Cervical back: Normal range of motion.  Lymphadenopathy:     Cervical: No cervical adenopathy.  Neurological:     General: No focal deficit present.     Mental Status: She is alert and oriented to person, place, and time.     Cranial Nerves: No cranial nerve deficit.  Skin:    General: Skin is warm and dry.  Psychiatric:         Mood and Affect: Mood normal.        Behavior: Behavior normal.        Thought Content: Thought content normal.        Judgment: Judgment normal.  Vitals reviewed.     Assessment/Plan: Encounter for annual routine gynecological examination  Cervical cancer screening - Plan: Cytology - PAP  Screening for HPV (human papillomavirus) - Plan: Cytology - PAP  Encounter for screening mammogram for malignant neoplasm of breast; pt current on mammo  Screening for colon cancer--colonoscopy/cologuard discussed. Pt declines any screening.   External hemorrhoid - Plan: hydrocortisone-pramoxine (ANALPRAM-HC) 2.5-1 % rectal cream; Rx  RF, sitz baths prn sx.   Blood tests for routine general physical examination--pt declines labs.    No orders of the defined types were placed in this encounter.    GYN counsel adequate intake of calcium and vitamin D, diet and exercise     F/U  No follow-ups on file.  Briannie Gutierrez B. Vernell Townley, PA-C 08/22/2022 7:17 PM

## 2022-08-23 ENCOUNTER — Other Ambulatory Visit (HOSPITAL_COMMUNITY)
Admission: RE | Admit: 2022-08-23 | Discharge: 2022-08-23 | Disposition: A | Discharge status: Home / Self Care | Payer: BC Managed Care – PPO | Source: Ambulatory Visit | Attending: Obstetrics and Gynecology | Admitting: Obstetrics and Gynecology

## 2022-08-23 ENCOUNTER — Ambulatory Visit (INDEPENDENT_AMBULATORY_CARE_PROVIDER_SITE_OTHER): Payer: BC Managed Care – PPO | Admitting: Obstetrics and Gynecology

## 2022-08-23 ENCOUNTER — Encounter: Payer: Self-pay | Admitting: Obstetrics and Gynecology

## 2022-08-23 VITALS — BP 100/60 | Ht 67.0 in | Wt 114.0 lb

## 2022-08-23 DIAGNOSIS — Z124 Encounter for screening for malignant neoplasm of cervix: Secondary | ICD-10-CM | POA: Insufficient documentation

## 2022-08-23 DIAGNOSIS — R87612 Low grade squamous intraepithelial lesion on cytologic smear of cervix (LGSIL): Secondary | ICD-10-CM | POA: Insufficient documentation

## 2022-08-23 DIAGNOSIS — Z1151 Encounter for screening for human papillomavirus (HPV): Secondary | ICD-10-CM | POA: Insufficient documentation

## 2022-08-23 DIAGNOSIS — Z1231 Encounter for screening mammogram for malignant neoplasm of breast: Secondary | ICD-10-CM

## 2022-08-23 DIAGNOSIS — K648 Other hemorrhoids: Secondary | ICD-10-CM

## 2022-08-23 DIAGNOSIS — Z13220 Encounter for screening for lipoid disorders: Secondary | ICD-10-CM

## 2022-08-23 DIAGNOSIS — Z01419 Encounter for gynecological examination (general) (routine) without abnormal findings: Secondary | ICD-10-CM

## 2022-08-23 DIAGNOSIS — Z1211 Encounter for screening for malignant neoplasm of colon: Secondary | ICD-10-CM

## 2022-08-23 DIAGNOSIS — Z0000 Encounter for general adult medical examination without abnormal findings: Secondary | ICD-10-CM

## 2022-08-23 MED ORDER — HYDROCORT-PRAMOXINE (PERIANAL) 2.5-1 % EX CREA: TOPICAL_CREAM | Freq: Three times a day (TID) | CUTANEOUS | 2 refills | Status: DC

## 2022-08-23 NOTE — Patient Instructions (Signed)
I value your feedback and you entrusting us with your care. If you get a [facility] patient survey, I would appreciate you taking the time to let us know about your experience today. Thank you!  [facility] ([place])--[phone]  

## 2022-08-27 ENCOUNTER — Telehealth: Payer: Self-pay

## 2022-08-30 NOTE — Telephone Encounter (Signed)
Pt called back. She states she has been getting this Rx using goodRx coupon and she plans to continue using it. She received notification from pharmacy that there was a delay and that's why she hasn't picked it up. I advised pt to call her pharmacy to clarify the delay and let us know if we can do anything for her.

## 2022-08-30 NOTE — Telephone Encounter (Signed)
Called pt, no answer, LVMTRC. 

## 2022-09-09 ENCOUNTER — Ambulatory Visit
Admission: RE | Admit: 2022-09-09 | Discharge: 2022-09-09 | Disposition: A | Discharge status: Home / Self Care | Payer: BC Managed Care – PPO | Source: Ambulatory Visit | Attending: Obstetrics and Gynecology | Admitting: Obstetrics and Gynecology

## 2022-09-09 DIAGNOSIS — Z1231 Encounter for screening mammogram for malignant neoplasm of breast: Secondary | ICD-10-CM | POA: Insufficient documentation

## 2022-10-24 IMAGING — MG MM DIGITAL SCREENING BILAT W/ TOMO AND CAD
8 series · 9 of 24 positions shown · non-contrast
Comparison: Previous exam(s).

CLINICAL DATA: Screening.

EXAM:
DIGITAL SCREENING BILATERAL MAMMOGRAM WITH TOMOSYNTHESIS AND CAD
TECHNIQUE: Bilateral screening digital craniocaudal and mediolateral oblique
mammograms were obtained. Bilateral screening digital breast
tomosynthesis was performed. The images were evaluated with
computer-aided detection.

[R MLO synth-2D]
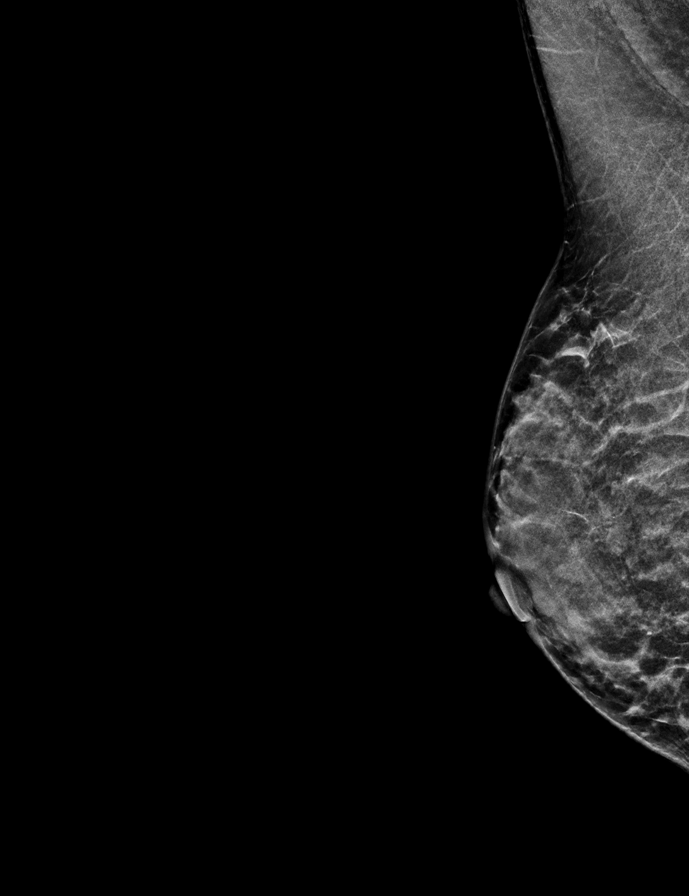

[R CC synth-2D]
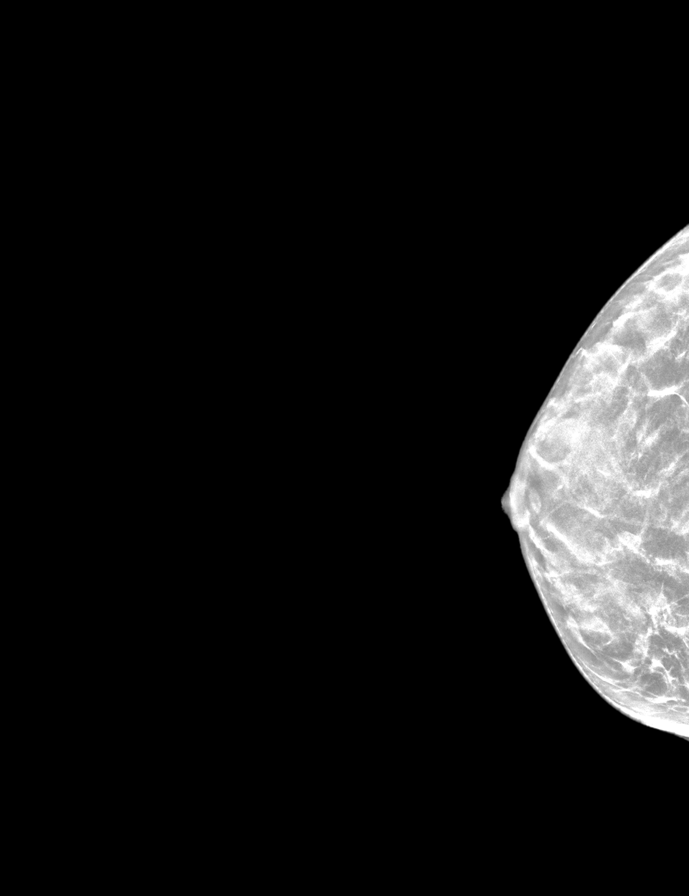

[L MLO synth-2D]
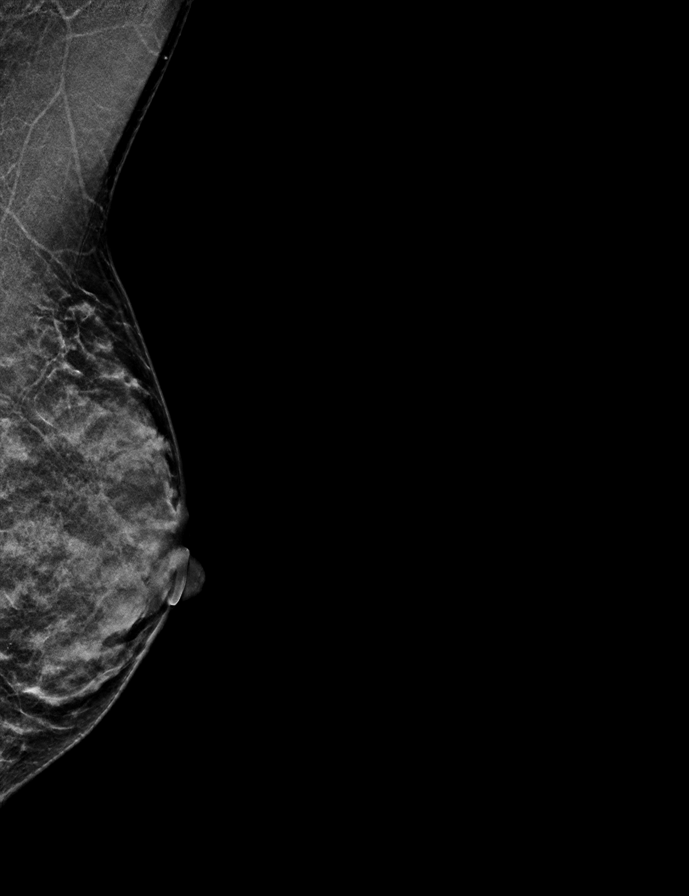

[L CC synth-2D]
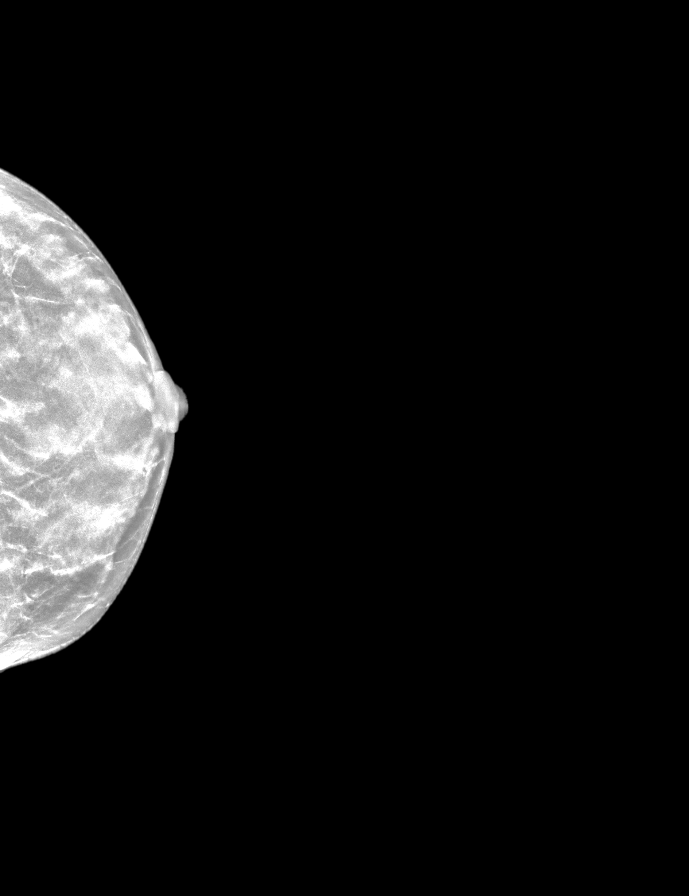

[R CC tomo · 2 of 47 frames shown]
[frame 16/47]
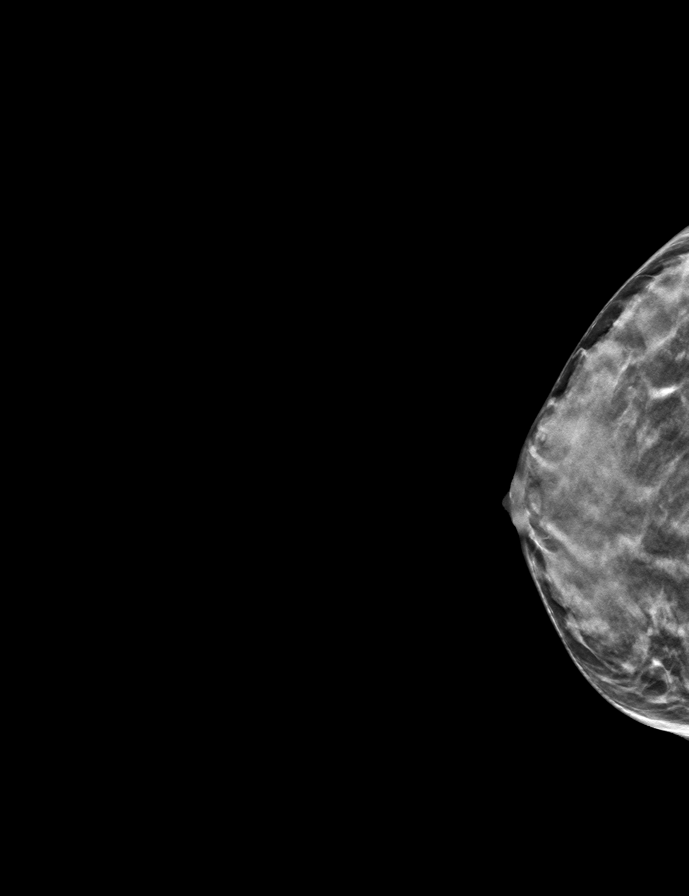
[frame 24/47]
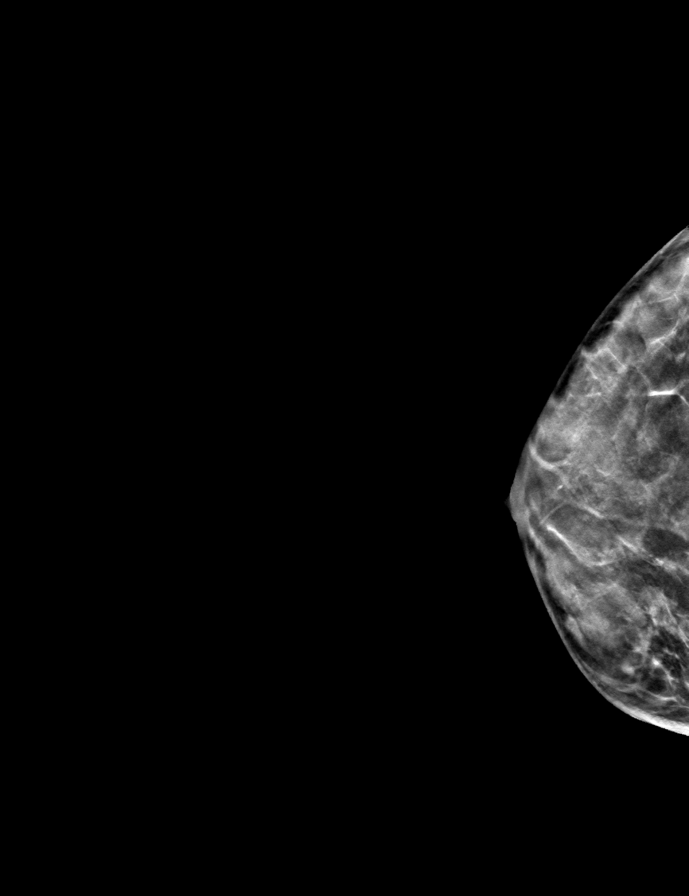

[L MLO tomo · tomo slice 13/25.0]
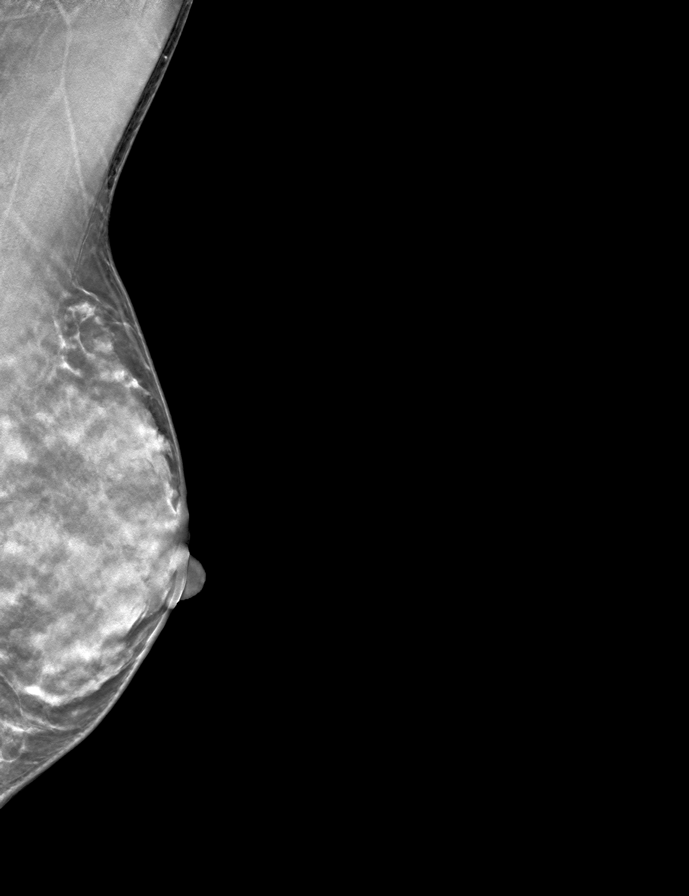

[L CC tomo · tomo slice 21/42.0]
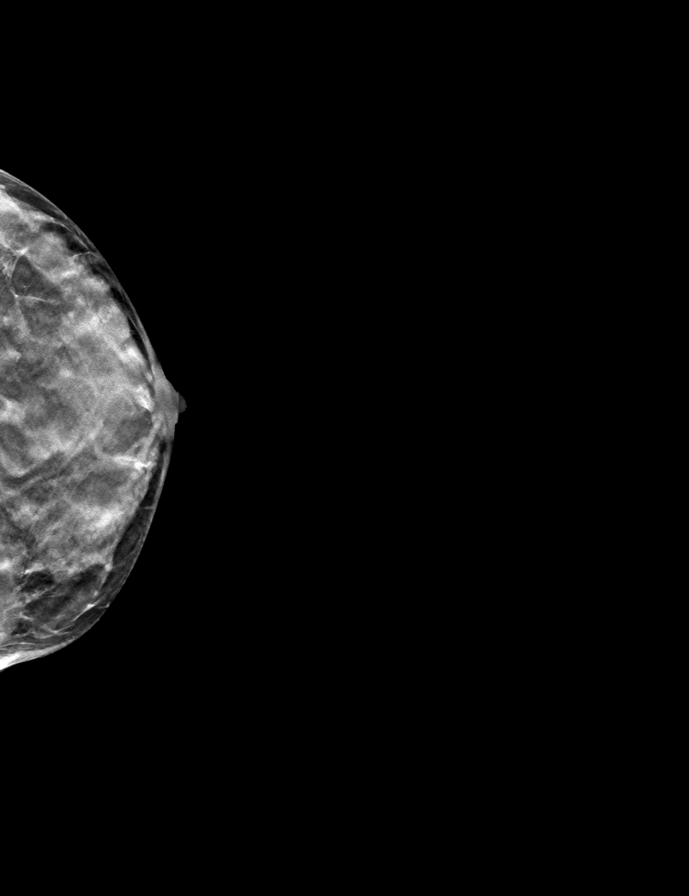

[R MLO tomo · tomo slice 15/30.0]
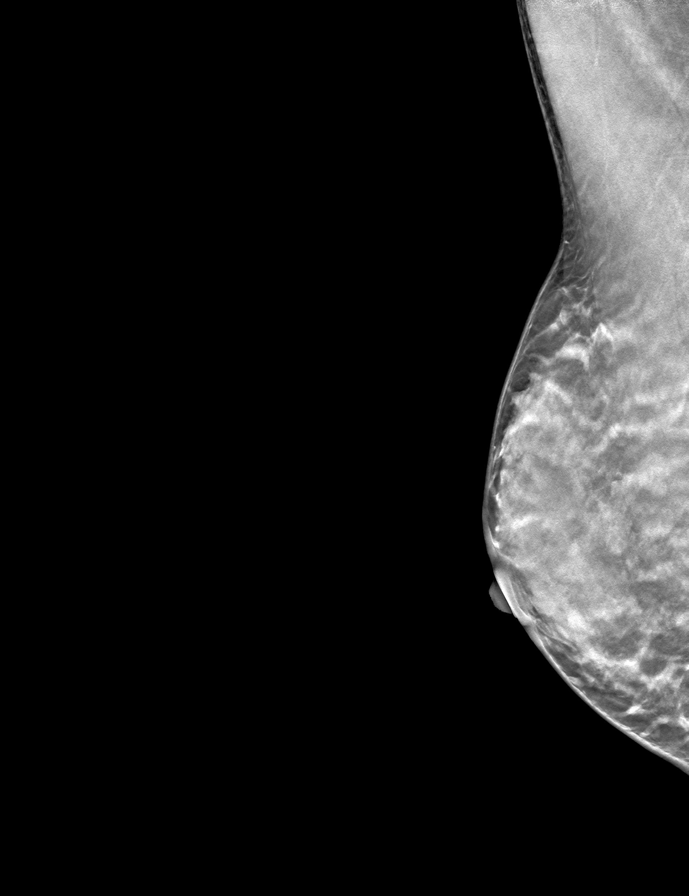

[9 of 24 positions shown; findings below may reference images not displayed]

ACR Breast Density Category d: The breast tissue is extremely dense,
which lowers the sensitivity of mammography
FINDINGS: There are no findings suspicious for malignancy.
IMPRESSION: No mammographic evidence of malignancy. A result letter of this
screening mammogram will be mailed directly to the patient.

RECOMMENDATION:
Screening mammogram in one year. (Code:TA-V-WV9)

BI-RADS CATEGORY  1: Negative.

## 2022-12-03 ENCOUNTER — Other Ambulatory Visit: Payer: Self-pay | Admitting: Obstetrics and Gynecology

## 2022-12-03 DIAGNOSIS — K648 Other hemorrhoids: Secondary | ICD-10-CM

## 2023-03-16 ENCOUNTER — Other Ambulatory Visit: Payer: Self-pay | Admitting: Obstetrics and Gynecology

## 2023-03-16 DIAGNOSIS — K648 Other hemorrhoids: Secondary | ICD-10-CM

## 2023-03-26 ENCOUNTER — Other Ambulatory Visit: Payer: Self-pay | Admitting: Obstetrics and Gynecology

## 2023-03-26 DIAGNOSIS — K648 Other hemorrhoids: Secondary | ICD-10-CM

## 2023-05-02 ENCOUNTER — Other Ambulatory Visit: Payer: Self-pay

## 2023-05-02 DIAGNOSIS — K648 Other hemorrhoids: Secondary | ICD-10-CM

## 2023-05-02 MED ORDER — HYDROCORT-PRAMOXINE (PERIANAL) 2.5-1 % EX CREA: TOPICAL_CREAM | Freq: Four times a day (QID) | CUTANEOUS | 2 refills | Status: DC

## 2023-08-11 ENCOUNTER — Other Ambulatory Visit: Payer: Self-pay | Admitting: Obstetrics and Gynecology

## 2023-08-11 DIAGNOSIS — Z1231 Encounter for screening mammogram for malignant neoplasm of breast: Secondary | ICD-10-CM

## 2023-08-25 NOTE — Progress Notes (Signed)
 Chief Complaint  Patient presents with   Gynecologic Exam    No concerns     HPI:      [name] is a 48 y.o. No obstetric history on file. who LMP was Patient's last menstrual period was 08/25/2023 (exact date)., presents today for her annual examination.  Her menses are regular every 28 days, lasting 3-4 days, light to mod flow.  Dysmenorrhea mild, occurring first 1-2 days of flow. Now has occas BTB for several days mid cycle, spotting to light flow. No VS sx.   Sex activity: single partner, hx of infertility. No pain/bleeding/dryness. Last Pap: 08/23/22 Results were ASCUS/POS HPV DNA; repeat pap due in 1 yr per [facility] 08/20/21 Results were: LGSIL/POS HPV DNA; 09/22/21 with neg colpo bx  Last mammogram: 09/09/22 Results were: normal; repeat in 12 months. Has appt 8/25. Hx of Birads 4 for RT breast mass 2018 that was aspirated and resolved.  There is no FH of breast cancer. There is no FH of ovarian cancer. The patient does self-breast exams.  Tobacco use: The patient denies current or previous tobacco use. Alcohol use: none  No drug use. Exercise:  very active  Colonoscopy: never, declines colonoscopy/cologuard. FH colon cancer in her PGF. Will want colonoscopy when desires screening, but not ready now.  Hx of internal hemorrhoids, rare rectal bleeding; doing anusol  with sx relief; has to use for about a wk. She needs a Rx RF on analpram .   She does get adequate calcium and Vitamin D in her diet. Labs WNL 2024   Past Medical History:  Diagnosis Date   Atypical squamous cell changes of undetermined significance (ASCUS) on cervical cytology with negative high risk human papilloma virus (HPV) test result 08/14/2015   History of Papanicolaou smear of cervix 08/14/2015   ASCUS/neg   Infertility, female     Past Surgical History:  Procedure Laterality Date   WISDOM TOOTH EXTRACTION      Family History  Problem Relation Age of Onset   Diabetes Mother    Hypertension  Mother    Diabetes Maternal Grandmother    Heart disease Maternal Grandmother    Hypertension Maternal Grandmother    Colon cancer Paternal Grandfather    Breast cancer Neg Hx    Ovarian cancer Neg Hx     Social History   Socioeconomic History   Marital status: Single    Spouse name: Not on file   Number of children: Not on file   Years of education: Not on file   Highest education level: Not on file  Occupational History   Not on file  Tobacco Use   Smoking status: Never   Smokeless tobacco: Never  Vaping Use   Vaping status: Never Used  Substance and Sexual Activity   Alcohol use: No   Drug use: No   Sexual activity: Yes    Birth control/protection: None  Other Topics Concern   Not on file  Social History Narrative   Not on file   Social Drivers of Health   Financial Resource Strain: Not on file  Food Insecurity: Not on file  Transportation Needs: Not on file  Physical Activity: Not on file  Stress: Not on file  Social Connections: Not on file  Intimate Partner Violence: Not on file     Current Outpatient Medications:    Calcium Carb-Cholecalciferol (CALCIUM 600+D3) 600-10 MG-MCG TABS, , Disp: , Rfl:    ipratropium (ATROVENT) 0.03 % nasal spray, Place 2 sprays into both  nostrils 3 (three) times daily., Disp: , Rfl:    MAGNESIUM PO, Take 300 mg by mouth., Disp: , Rfl:    vitamin B-12 (CYANOCOBALAMIN) 100 MCG tablet, Take 100 mcg by mouth daily., Disp: , Rfl:    hydrocortisone -pramoxine (ANALPRAM -HC) 2.5-1 % rectal cream, Place rectally 4 (four) times daily., Disp: 30 g, Rfl: 3  ROS:  Review of Systems  Constitutional:  Negative for fatigue, fever and unexpected weight change.  Respiratory:  Negative for cough, shortness of breath and wheezing.   Cardiovascular:  Negative for chest pain, palpitations and leg swelling.  Gastrointestinal:  Negative for blood in stool, constipation, diarrhea, nausea and vomiting.  Endocrine: Negative for cold intolerance, heat  intolerance and polyuria.  Genitourinary:  Positive for vaginal bleeding. Negative for dyspareunia, dysuria, flank pain, frequency, genital sores, hematuria, menstrual problem, pelvic pain, urgency, vaginal discharge and vaginal pain.  Musculoskeletal:  Negative for back pain, joint swelling and myalgias.  Skin:  Negative for rash.  Neurological:  Negative for dizziness, syncope, light-headedness, numbness and headaches.  Hematological:  Negative for adenopathy.  Psychiatric/Behavioral:  Negative for agitation, confusion, sleep disturbance and suicidal ideas. The patient is not nervous/anxious.      Objective: BP (!) 91/56   Pulse 65   Ht 5' 7 (1.702 m)   Wt 120 lb (54.4 kg)   LMP 08/25/2023 (Exact Date)   BMI 18.79 kg/m    Physical Exam Constitutional:      Appearance: She is well-developed.  Genitourinary:     Vulva normal.     Right Labia: No rash, tenderness or lesions.    Left Labia: No tenderness, lesions or rash.    No vaginal discharge, erythema or tenderness.      Right Adnexa: not tender and no mass present.    Left Adnexa: not tender and no mass present.    No cervical motion tenderness, friability or polyp.     Uterus is not enlarged or tender.  Rectum:     Internal hemorrhoid present.     No rectal mass, anal fissure, external hemorrhoid or abnormal anal tone.  Breasts:    Right: No mass, nipple discharge, skin change or tenderness.     Left: No mass, nipple discharge, skin change or tenderness.  Neck:     Thyroid: No thyromegaly.  Cardiovascular:     Rate and Rhythm: Normal rate and regular rhythm.     Heart sounds: Normal heart sounds. No murmur heard. Pulmonary:     Effort: Pulmonary effort is normal.     Breath sounds: Normal breath sounds.  Abdominal:     Palpations: Abdomen is soft.     Tenderness: There is no abdominal tenderness. There is no guarding or rebound.  Musculoskeletal:        General: Normal range of motion.     Cervical back:  Normal range of motion.  Lymphadenopathy:     Cervical: No cervical adenopathy.  Neurological:     General: No focal deficit present.     Mental Status: She is alert and oriented to person, place, and time.     Cranial Nerves: No cranial nerve deficit.  Skin:    General: Skin is warm and dry.  Psychiatric:        Mood and Affect: Mood normal.        Behavior: Behavior normal.        Thought Content: Thought content normal.        Judgment: Judgment normal.  Vitals reviewed.  Assessment/Plan: Encounter for annual routine gynecological examination  Cervical cancer screening - Plan: Cytology - PAP  Screening for HPV (human papillomavirus) - Plan: Cytology - PAP  LGSIL on Pap smear of cervix - Plan: Cytology - PAP; repeat pap today, will f/u with results.   Encounter for screening mammogram for malignant neoplasm of breast; pt has mammo scheduled.   Screening for colon cancer--colonoscopy/cologuard discussed. Pt declines both.   Internal hemorrhoid - Plan: hydrocortisone -pramoxine (ANALPRAM -HC) 2.5-1 % rectal cream; Rx RF eRxd.   Abnormal uterine bleeding (AUB)--recommended Gyn u/s but pt declines for now. Will f/u if sx worsen/bleeding persists.    Meds ordered this encounter  Medications   hydrocortisone -pramoxine (ANALPRAM -HC) 2.5-1 % rectal cream    Sig: Place rectally 4 (four) times daily.    Dispense:  30 g    Refill:  3    Supervising Provider:   ROBY, MICIA [[id number]]    GYN counsel adequate intake of calcium and vitamin D, diet and exercise     F/U  Return in about 1 year (around 08/28/2024).  Banks Chaikin B. Andrei Mccook, PA-C 08/29/2023 2:11 PM

## 2023-08-29 ENCOUNTER — Encounter: Payer: Self-pay | Admitting: Obstetrics and Gynecology

## 2023-08-29 ENCOUNTER — Other Ambulatory Visit (HOSPITAL_COMMUNITY)
Admission: RE | Admit: 2023-08-29 | Discharge: 2023-08-29 | Disposition: A | Discharge status: Home / Self Care | Source: Ambulatory Visit | Attending: Obstetrics and Gynecology | Admitting: Obstetrics and Gynecology

## 2023-08-29 ENCOUNTER — Ambulatory Visit (INDEPENDENT_AMBULATORY_CARE_PROVIDER_SITE_OTHER): Payer: Self-pay | Admitting: Obstetrics and Gynecology

## 2023-08-29 VITALS — BP 91/56 | HR 65 | Ht 67.0 in | Wt 120.0 lb

## 2023-08-29 DIAGNOSIS — Z124 Encounter for screening for malignant neoplasm of cervix: Secondary | ICD-10-CM

## 2023-08-29 DIAGNOSIS — N939 Abnormal uterine and vaginal bleeding, unspecified: Secondary | ICD-10-CM

## 2023-08-29 DIAGNOSIS — Z1231 Encounter for screening mammogram for malignant neoplasm of breast: Secondary | ICD-10-CM

## 2023-08-29 DIAGNOSIS — Z01411 Encounter for gynecological examination (general) (routine) with abnormal findings: Secondary | ICD-10-CM | POA: Diagnosis not present

## 2023-08-29 DIAGNOSIS — Z1151 Encounter for screening for human papillomavirus (HPV): Secondary | ICD-10-CM | POA: Diagnosis present

## 2023-08-29 DIAGNOSIS — Z01419 Encounter for gynecological examination (general) (routine) without abnormal findings: Secondary | ICD-10-CM

## 2023-08-29 DIAGNOSIS — R87612 Low grade squamous intraepithelial lesion on cytologic smear of cervix (LGSIL): Secondary | ICD-10-CM | POA: Diagnosis present

## 2023-08-29 DIAGNOSIS — K648 Other hemorrhoids: Secondary | ICD-10-CM

## 2023-08-29 DIAGNOSIS — Z1211 Encounter for screening for malignant neoplasm of colon: Secondary | ICD-10-CM

## 2023-08-29 MED ORDER — HYDROCORT-PRAMOXINE (PERIANAL) 2.5-1 % EX CREA: TOPICAL_CREAM | Freq: Four times a day (QID) | CUTANEOUS | 3 refills | Status: AC

## 2023-08-29 NOTE — Patient Instructions (Signed)
 I value your feedback and you entrusting [place] with your care. If you get a [facility] patient survey, I would appreciate you taking the time to let us know about your experience today. Thank you! ? ? ?

## 2023-09-02 LAB — CYTOLOGY - PAP
Comment: NEGATIVE
Diagnosis: UNDETERMINED — AB
High risk HPV: NEGATIVE

## 2023-09-03 ENCOUNTER — Ambulatory Visit: Payer: Self-pay | Admitting: Obstetrics and Gynecology

## 2023-09-08 NOTE — Progress Notes (Signed)
 Pls call pt to schedule colpo with MD. Thx.

## 2023-09-13 ENCOUNTER — Telehealth: Payer: Self-pay

## 2023-09-13 NOTE — Telephone Encounter (Signed)
 LMTRC

## 2023-09-13 NOTE — Telephone Encounter (Signed)
 Patient contacted office stating that she received message about an abnormal pap smear result and need to schedule colposcopy. Patient states that she had questions that she would like to discuss with Alicia before scheduling this procedure and is requesting a call back from [place] when available at [phone]

## 2023-09-14 ENCOUNTER — Ambulatory Visit
Admission: RE | Admit: 2023-09-14 | Discharge: 2023-09-14 | Disposition: A | Discharge status: Home / Self Care | Payer: Self-pay | Source: Ambulatory Visit | Attending: Obstetrics and Gynecology | Admitting: Obstetrics and Gynecology

## 2023-09-14 DIAGNOSIS — Z1231 Encounter for screening mammogram for malignant neoplasm of breast: Secondary | ICD-10-CM | POA: Diagnosis present

## 2023-09-18 ENCOUNTER — Ambulatory Visit: Payer: Self-pay | Admitting: Obstetrics and Gynecology

## 2023-10-03 ENCOUNTER — Ambulatory Visit: Admitting: Obstetrics & Gynecology

## 2023-10-11 ENCOUNTER — Ambulatory Visit: Admitting: Obstetrics & Gynecology

## 2023-10-11 ENCOUNTER — Other Ambulatory Visit (HOSPITAL_COMMUNITY)
Admission: RE | Admit: 2023-10-11 | Discharge: 2023-10-11 | Disposition: A | Discharge status: Home / Self Care | Source: Ambulatory Visit | Attending: Obstetrics & Gynecology | Admitting: Obstetrics & Gynecology

## 2023-10-11 VITALS — BP 100/65 | HR 58 | Ht 67.0 in | Wt 118.8 lb

## 2023-10-11 DIAGNOSIS — R87610 Atypical squamous cells of undetermined significance on cytologic smear of cervix (ASC-US): Secondary | ICD-10-CM | POA: Insufficient documentation

## 2023-10-11 DIAGNOSIS — Z3202 Encounter for pregnancy test, result negative: Secondary | ICD-10-CM | POA: Diagnosis not present

## 2023-10-11 DIAGNOSIS — N882 Stricture and stenosis of cervix uteri: Secondary | ICD-10-CM | POA: Diagnosis not present

## 2023-10-11 DIAGNOSIS — Z01812 Encounter for preprocedural laboratory examination: Secondary | ICD-10-CM

## 2023-10-11 LAB — POCT URINE PREGNANCY: Preg Test, Ur: NEGATIVE

## 2023-10-11 NOTE — Progress Notes (Signed)
    GYNECOLOGY PROGRESS NOTE  Subjective:    Patient ID: Julie Reed, female    DOB: 1975-01-28, 48 y.o.   MRN: [id number]  HPI  Patient is a 48 y.o. G0P0000 here for a colpo. She had a ASCUS HR HPV negative pap last month and last year. In 2023 she had LGSIL pap and a normal colpo and negative ECC.   The following portions of the patient's history were reviewed and updated as appropriate: allergies, current medications, past family history, past medical history, past social history, past surgical history, and problem list.  Review of Systems Pertinent items are noted in HPI.   Objective:   There were no vitals taken for this visit. There is no height or weight on file to calculate BMI. Well nourished, well hydrated White female, no apparent distress She is ambulating and conversing normally. UPT negative, consent signed, time out done Speculum placed. Cervix prepped with acetic acid. Cervix appeared stenotic. Transformation zone seen in its entirety after dilating the cervix.  Colpo adequate. Colposcopic findings normal. ECC obtained. She tolerated the procedure well.    Assessment:   ASCUS x 2 H/o LGSIL  Plan:  - await pathology - I will message her Monday with results

## 2023-10-13 LAB — SURGICAL PATHOLOGY

## 2023-10-17 ENCOUNTER — Encounter: Payer: Self-pay | Admitting: Obstetrics & Gynecology

## 2023-11-11 ENCOUNTER — Encounter: Payer: Self-pay | Admitting: Obstetrics and Gynecology
